# Patient Record
Sex: Male | Born: 1975 | Race: White | Hispanic: No | Marital: Single | Smoking: Never smoker
Health system: Southern US, Community
[De-identification: ages and names within clinical notes are randomized; demographics above are authoritative.]

## PROBLEM LIST (undated history)

## (undated) DIAGNOSIS — A4902 Methicillin resistant Staphylococcus aureus infection, unspecified site: Secondary | ICD-10-CM

## (undated) DIAGNOSIS — Z87442 Personal history of urinary calculi: Secondary | ICD-10-CM

## (undated) DIAGNOSIS — M5126 Other intervertebral disc displacement, lumbar region: Secondary | ICD-10-CM

## (undated) DIAGNOSIS — M51369 Other intervertebral disc degeneration, lumbar region without mention of lumbar back pain or lower extremity pain: Secondary | ICD-10-CM

## (undated) DIAGNOSIS — N200 Calculus of kidney: Secondary | ICD-10-CM

## (undated) DIAGNOSIS — M5136 Other intervertebral disc degeneration, lumbar region: Secondary | ICD-10-CM

## (undated) HISTORY — DX: Methicillin resistant Staphylococcus aureus infection, unspecified site: A49.02

## (undated) HISTORY — DX: Other intervertebral disc degeneration, lumbar region without mention of lumbar back pain or lower extremity pain: M51.369

## (undated) HISTORY — DX: Other intervertebral disc displacement, lumbar region: M51.26

## (undated) HISTORY — DX: Calculus of kidney: N20.0

## (undated) HISTORY — DX: Other intervertebral disc degeneration, lumbar region: M51.36

## (undated) HISTORY — PX: HERNIA REPAIR: SHX51

---

## 2015-11-04 ENCOUNTER — Ambulatory Visit (INDEPENDENT_AMBULATORY_CARE_PROVIDER_SITE_OTHER): Payer: BLUE CROSS/BLUE SHIELD | Admitting: Family Medicine

## 2015-11-04 ENCOUNTER — Encounter: Payer: Self-pay | Admitting: Family Medicine

## 2015-11-04 VITALS — BP 128/82 | HR 78 | Temp 97.5°F | Ht 70.25 in | Wt 174.0 lb

## 2015-11-04 DIAGNOSIS — Z7251 High risk heterosexual behavior: Secondary | ICD-10-CM

## 2015-11-04 DIAGNOSIS — Z0000 Encounter for general adult medical examination without abnormal findings: Secondary | ICD-10-CM | POA: Diagnosis not present

## 2015-11-04 DIAGNOSIS — Z23 Encounter for immunization: Secondary | ICD-10-CM

## 2015-11-04 DIAGNOSIS — M5136 Other intervertebral disc degeneration, lumbar region: Secondary | ICD-10-CM | POA: Insufficient documentation

## 2015-11-04 DIAGNOSIS — M5126 Other intervertebral disc displacement, lumbar region: Secondary | ICD-10-CM | POA: Insufficient documentation

## 2015-11-04 NOTE — Progress Notes (Signed)
Phone: [phone]  Subjective:  Patient presents today to establish care. Last cared for about 10 years ago but has been healthy and no issues- no need to obtain records. Chief complaint-noted.   See problem oriented charting  The following were reviewed and entered/updated in epic: Past Medical History  Diagnosis Date  . L4-L5 disc bulge     Pt states he has rec'd 2 injections 6 months apart, last one was 10/28/15  . MRSA infection     infected hair follice in nose. now cleared   Patient Active Problem List   Diagnosis Date Noted  . L4-L5 disc bulge    Past Surgical History  Procedure Laterality Date  . Hernia repair Bilateral     left 1986, right 2005    Family History  Problem Relation Age of Onset  . Hypertension Father   . Diabetes Father   . Prostate cancer Father     age 34  . Diabetes Maternal Grandmother   . Stroke Maternal Grandfather   . Breast cancer Paternal Grandmother   . Heart disease Paternal Grandfather 59    smoker    Medications- reviewed and updated Current Outpatient Prescriptions  Medication Sig Dispense Refill  . methocarbamol (ROBAXIN) 750 MG tablet Take 1 tablet by mouth 4 (four) times daily as needed.  0   No current facility-administered medications for this visit.    Allergies-reviewed and updated No Known Allergies  Social History   Social History  . Marital Status: Single    Spouse Name: N/A  . Number of Children: N/A  . Years of Education: N/A   Social History Main Topics  . Smoking status: Never Smoker   . Smokeless tobacco: Never Used  . Alcohol Use: 0.0 oz/week    0 Standard drinks or equivalent per week     Comment: 1-2x/year  . Drug Use: No  . Sexual Activity: Yes     Comment: unprotected   Other Topics Concern  . None   Social History Narrative   Single. Have a partner- 9 years. May or may not get married. No kids.    Partner's brother also lives with them.       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    College at [employer]: loves working (states it his hobby) also Financial planner    ROS--Full ROS was completed Review of Systems  Constitutional: Negative for fever and chills.  HENT: Negative for hearing loss and tinnitus.   Eyes: Negative for blurred vision, double vision, pain and discharge.  Respiratory: Negative for cough, hemoptysis, shortness of breath and wheezing.   Cardiovascular: Negative for chest pain, palpitations (rarely after caffeine) and leg swelling.  Gastrointestinal: Negative for heartburn, nausea, vomiting and abdominal pain.  Genitourinary: Negative for dysuria, urgency and frequency.  Musculoskeletal: Positive for back pain. Negative for myalgias and neck pain.  Skin: Negative for itching and rash.  Neurological: Negative for dizziness, tingling and headaches.  Endo/Heme/Allergies: Negative for polydipsia. Does not bruise/bleed easily.  Psychiatric/Behavioral: Negative for depression, suicidal ideas, hallucinations and substance abuse. The patient is not nervous/anxious.    Objective: BP 128/82 mmHg  Pulse 78  Temp(Src) 97.5 F (36.4 C) (Oral)  Ht 5' 10.25" (1.784 m)  Wt 174 lb (78.926 kg)  BMI 24.80 kg/m2  SpO2 98% Gen: NAD, resting comfortably HEENT: Mucous membranes are moist. Oropharynx normal. TM normal. Eyes: sclera and lids normal, PERRLA Neck: no thyromegaly, no  cervical lymphadenopathy CV: RRR no murmurs rubs or gallops Lungs: CTAB no crackles, wheeze, rhonchi Abdomen: soft/nontender/nondistended/normal bowel sounds. No rebound or guarding.  Ext: no edema Skin: warm, dry Neuro: 5/5 strength in upper and lower extremities, normal gait, normal reflexes  Assessment/Plan:  40 y.o. male presenting for annual physical.  Health Maintenance counseling: 1. Anticipatory guidance: Patient counseled regarding regular dental exams, eye exams (lasik surgery), wearing seatbelts.  2. Risk factor  reduction:  Advised patient of need for regular exercise and diet rich and fruits and vegetables to reduce risk of heart attack and stroke. Doing very well here. lost 30 lbs over last year, exercising regulary 3. Immunizations/screenings/ancillary studies- Tdap today  Health Maintenance Due  Topic Date Due  . HIV Screening - with labs 05/22/1993  . TETANUS/TDAP - today 05/22/1997   4. Prostate cancer screening- start at age 31 with dad's history prostate cancer at 57  5. Colon cancer screening - start at age 49 6. STD screening- long term partner but unprotected- wants to update STD testing. Unknown Hep B immunization status.   1-2 year CPE. Return precautions advised.   Orders Placed This Encounter  Procedures  . Tdap vaccine greater than or equal to 7yo IM  . Comprehensive metabolic panel    [place]    Standing Status: Future     Number of Occurrences:      Standing Expiration Date: 11/03/2016  . CBC    Standing Status: Future     Number of Occurrences:      Standing Expiration Date: 11/03/2016  . Lipid panel    Standing Status: Future     Number of Occurrences:      Standing Expiration Date: 11/03/2016  . HIV antibody    [facility]    Standing Status: Future     Number of Occurrences:      Standing Expiration Date: 11/03/2016  . RPR    [facility]    Standing Status: Future     Number of Occurrences:      Standing Expiration Date: 11/03/2016  . Hepatitis B surface antigen    Standing Status: Future     Number of Occurrences:      Standing Expiration Date: 11/03/2016  . Hepatitis B surface antibody    Standing Status: Future     Number of Occurrences:      Standing Expiration Date: 11/03/2016  . Hepatitis B core antibody, total    Standing Status: Future     Number of Occurrences:      Standing Expiration Date: 11/03/2016    Meds ordered this encounter  Medications  . methocarbamol (ROBAXIN) 750 MG tablet    Sig: Take 1 tablet by mouth 4 (four) times daily as needed.    Refill:  0     Garret Reddish, MD

## 2015-11-04 NOTE — Patient Instructions (Addendum)
Tdap today  Schedule a lab visit at the check out desk within 2 weeks. Return for future fasting labs meaning nothing but water after midnight please. Ok to take your medications with water.

## 2015-11-07 ENCOUNTER — Other Ambulatory Visit (HOSPITAL_COMMUNITY)
Admission: RE | Admit: 2015-11-07 | Discharge: 2015-11-07 | Disposition: A | Discharge status: Home / Self Care | Payer: BLUE CROSS/BLUE SHIELD | Source: Ambulatory Visit | Attending: Family Medicine | Admitting: Family Medicine

## 2015-11-07 ENCOUNTER — Other Ambulatory Visit (INDEPENDENT_AMBULATORY_CARE_PROVIDER_SITE_OTHER): Payer: BLUE CROSS/BLUE SHIELD

## 2015-11-07 DIAGNOSIS — Z113 Encounter for screening for infections with a predominantly sexual mode of transmission: Secondary | ICD-10-CM | POA: Diagnosis present

## 2015-11-07 DIAGNOSIS — Z0000 Encounter for general adult medical examination without abnormal findings: Secondary | ICD-10-CM

## 2015-11-07 DIAGNOSIS — Z7251 High risk heterosexual behavior: Secondary | ICD-10-CM

## 2015-11-07 LAB — COMPREHENSIVE METABOLIC PANEL
ALBUMIN: 4.5 g/dL (ref 3.5–5.2)
ALT: 27 U/L (ref 0–53)
AST: 18 U/L (ref 0–37)
Alkaline Phosphatase: 66 U/L (ref 39–117)
BUN: 11 mg/dL (ref 6–23)
CHLORIDE: 101 meq/L (ref 96–112)
CO2: 28 meq/L (ref 19–32)
CREATININE: 0.83 mg/dL (ref 0.40–1.50)
Calcium: 9.2 mg/dL (ref 8.4–10.5)
GFR: 110.53 mL/min (ref >60.00)
Glucose, Bld: 97 mg/dL (ref 70–99)
Potassium: 4 mEq/L (ref 3.5–5.1)
SODIUM: 135 meq/L (ref 135–145)
Total Bilirubin: 0.6 mg/dL (ref 0.2–1.2)
Total Protein: 7.1 g/dL (ref 6.0–8.3)

## 2015-11-07 LAB — CBC
HEMATOCRIT: 46.1 % (ref 39.0–52.0)
Hemoglobin: 15.6 g/dL (ref 13.0–17.0)
MCHC: 34 g/dL (ref 30.0–36.0)
MCV: 87.3 fl (ref 78.0–100.0)
Platelets: 262 10*3/uL (ref 150.0–400.0)
RBC: 5.28 Mil/uL (ref 4.22–5.81)
RDW: 13.7 % (ref 11.5–15.5)
WBC: 6.3 10*3/uL (ref 4.0–10.5)

## 2015-11-07 LAB — LIPID PANEL
CHOLESTEROL: 153 mg/dL (ref 0–200)
HDL: 47.8 mg/dL (ref >39.00)
LDL Cholesterol: 92 mg/dL (ref 0–99)
NonHDL: 105.68
Total CHOL/HDL Ratio: 3
Triglycerides: 70 mg/dL (ref 0.0–149.0)
VLDL: 14 mg/dL (ref 0.0–40.0)

## 2015-11-07 LAB — HEPATITIS B CORE ANTIBODY, TOTAL: HEP B C TOTAL AB: NONREACTIVE

## 2015-11-07 LAB — HIV ANTIBODY (ROUTINE TESTING W REFLEX): HIV: NONREACTIVE

## 2015-11-07 LAB — HEPATITIS B SURFACE ANTIBODY,QUALITATIVE: HEP B S AB: NEGATIVE

## 2015-11-07 LAB — HEPATITIS B SURFACE ANTIGEN: Hepatitis B Surface Ag: NEGATIVE

## 2015-11-08 LAB — RPR

## 2015-11-09 LAB — URINE CYTOLOGY ANCILLARY ONLY
Chlamydia: NEGATIVE
Neisseria Gonorrhea: NEGATIVE
Trichomonas: NEGATIVE

## 2016-04-10 ENCOUNTER — Other Ambulatory Visit (INDEPENDENT_AMBULATORY_CARE_PROVIDER_SITE_OTHER): Payer: BLUE CROSS/BLUE SHIELD

## 2016-04-10 ENCOUNTER — Other Ambulatory Visit (HOSPITAL_COMMUNITY)
Admission: RE | Admit: 2016-04-10 | Discharge: 2016-04-10 | Disposition: A | Discharge status: Home / Self Care | Payer: BLUE CROSS/BLUE SHIELD | Source: Ambulatory Visit | Attending: Family Medicine | Admitting: Family Medicine

## 2016-04-10 ENCOUNTER — Telehealth: Payer: Self-pay

## 2016-04-10 DIAGNOSIS — Z7251 High risk heterosexual behavior: Secondary | ICD-10-CM

## 2016-04-10 DIAGNOSIS — Z113 Encounter for screening for infections with a predominantly sexual mode of transmission: Secondary | ICD-10-CM | POA: Diagnosis not present

## 2016-04-10 NOTE — Telephone Encounter (Signed)
Patient aware.

## 2016-04-10 NOTE — Telephone Encounter (Signed)
Panel ordered. No urinating an hour before urine test. Dirty urine- pee straight into cup small amount without cleaning tip of penis.   This does not test for herpes or genital warts- those are based on visual inspection and testing usually

## 2016-04-10 NOTE — Telephone Encounter (Signed)
Patient requesting a full STD panel to be done. Would like to come by today at lunch to have labs drawn.

## 2016-04-11 LAB — HIV ANTIBODY (ROUTINE TESTING W REFLEX): HIV: NONREACTIVE

## 2016-04-11 LAB — RPR

## 2016-04-11 LAB — URINE CYTOLOGY ANCILLARY ONLY
CHLAMYDIA, DNA PROBE: NEGATIVE
NEISSERIA GONORRHEA: NEGATIVE
Trichomonas: NEGATIVE

## 2016-04-23 ENCOUNTER — Other Ambulatory Visit: Payer: Self-pay

## 2016-04-23 ENCOUNTER — Telehealth: Payer: Self-pay

## 2016-04-23 MED ORDER — PREDNISONE 20 MG PO TABS: 20.0000 mg | ORAL_TABLET | Freq: Every day | ORAL | 0 refills | Status: DC

## 2016-04-23 NOTE — Telephone Encounter (Signed)
Patient called and is sneezing and blowing out green mucous. Was on a cruise last week and got sick starting Tuesday.   States drainage was clear Tuesday & Wed. Thursday it turned yellow. Friday it was a dark yellow. Saturday it turned green and is still green.  He wanted to know if he can have something called in to [facility] (corner of [facility])?  He can't come in today as he is short staffed.

## 2016-04-23 NOTE — Telephone Encounter (Signed)
Day 7 of illness. Could still be viral sinusitis. If he would like to trial something we could do 5 day prednisone burst of 40mg  for 3 days then 20mg  for 2 days using 20mg  tablet. If not improving by Wednesday- we could call in antibiotic- would be day 9 but would be before holiday weekend so would be willing to call in. Would also advise mucinex OTC

## 2016-04-23 NOTE — Telephone Encounter (Signed)
Spoke with patient who verbalized understanding. Prescription for Prednisone sent to pharmacy

## 2016-11-16 ENCOUNTER — Encounter: Payer: Self-pay | Admitting: Family Medicine

## 2016-11-16 ENCOUNTER — Ambulatory Visit (INDEPENDENT_AMBULATORY_CARE_PROVIDER_SITE_OTHER): Payer: 59 | Admitting: Family Medicine

## 2016-11-16 VITALS — BP 120/82 | HR 88 | Temp 98.2°F | Ht 71.75 in | Wt 187.8 lb

## 2016-11-16 DIAGNOSIS — Z13220 Encounter for screening for lipoid disorders: Secondary | ICD-10-CM

## 2016-11-16 DIAGNOSIS — Z0000 Encounter for general adult medical examination without abnormal findings: Secondary | ICD-10-CM

## 2016-11-16 DIAGNOSIS — Z79899 Other long term (current) drug therapy: Secondary | ICD-10-CM | POA: Diagnosis not present

## 2016-11-16 DIAGNOSIS — Z23 Encounter for immunization: Secondary | ICD-10-CM

## 2016-11-16 NOTE — Patient Instructions (Addendum)
Twinrix today (Hepatitis A and B combo vaccine). jamie can help you schedule 1 month repeat and 6 month repeat injections.   Please stop by lab before you go  I like the idea of you getting back below 180.

## 2016-11-16 NOTE — Progress Notes (Signed)
Phone: [phone]  Subjective:  Patient presents today for their annual physical. Chief complaint-noted.   See problem oriented charting- ROS- full  review of systems was completed and negative except for low back pain usually improves with injections. No chest pain or shortness of breath. No headache or blurry vision.   The following were reviewed and entered/updated in epic: Past Medical History:  Diagnosis Date  . L4-L5 disc bulge    Pt states he has rec'd 2 injections 6 months apart, last one was 10/28/15  . MRSA infection    infected hair follice in nose. now cleared   Patient Active Problem List   Diagnosis Date Noted  . L4-L5 disc bulge    Past Surgical History:  Procedure Laterality Date  . HERNIA REPAIR Bilateral    left 1986, right 2005   Family History  Problem Relation Age of Onset  . Hypertension Father   . Diabetes Father   . Prostate cancer Father        age 63  . Diabetes Maternal Grandmother   . Stroke Maternal Grandfather   . Breast cancer Paternal Grandmother   . Heart disease Paternal Grandfather 40       smoker    Medications- reviewed and updated Current Outpatient Prescriptions  Medication Sig Dispense Refill  . methocarbamol (ROBAXIN) 750 MG tablet Take 1 tablet by mouth 4 (four) times daily as needed.  0   No current facility-administered medications for this visit.     Allergies-reviewed and updated No Known Allergies  Social History   Social History  . Marital status: Single    Spouse name: N/A  . Number of children: N/A  . Years of education: N/A   Social History Main Topics  . Smoking status: Never Smoker  . Smokeless tobacco: Never Used  . Alcohol use 0.0 oz/week     Comment: 1-2x/year  . Drug use: No  . Sexual activity: Yes     Comment: unprotected   Other Topics Concern  . None   Social History Narrative   Single. Have a partner- 9 years. May or may not get married. No kids.    Partner's brother also lives with  them.       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 Oropharynx normal Neck: no thyromegaly CV: RRR no murmurs rubs or gallops Lungs: CTAB no crackles, wheeze, rhonchi Abdomen: soft/nontender/nondistended/normal bowel sounds. No rebound or guarding.  Ext: no edema Skin: warm, dry, scaly red lesion on right forehead (to see dermatology) Neuro: grossly normal, moves all extremities, PERRLA  Assessment/Plan:  41 y.o. male presenting for annual physical.  Health Maintenance counseling: 1. Anticipatory guidance: Patient counseled regarding regular dental exams q6 months, eye exams -yearly due to lasik , wearing seatbelts.  2. Risk factor reduction:  Advised patient of need for regular exercise and diet rich and fruits and vegetables to reduce risk of heart attack and stroke. Exercise- 3 days a week at least. Diet-sleep has been down  and not eating quite as well. Prior had lost 30 pounds but now gained 13 lbs back. 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 Immunizations/screenings/ancillary studies-  Up to date other than Hep B and Hep A - opts in for these Immunization History  Administered Date(s) Administered  . Influenza-Unspecified 03/28/2016  . Tdap 11/04/2015  4. Prostate cancer screening- family history in father, he requests to start at age 66  5. Colon cancer screening - no family  history, start at age 51-50 6. Skin cancer screening/prevention- advised regular sunscreen use. Will see [facility] dermatology- possible [place] on scalp- prefer derm for cosmetic reasons 7. Testicular cancer screening- advised monthly self exams  8. STD screening- patient opts out- screened in November and monogamous since that time  Status of chronic or acute concerns   Injections through Dr. Nelva Bush for bulging disc recently about every 8 months or so  Screen lipids  Update cbc, cmp given intermittent robaxin and prednisone.   Return in about 1 year (around 11/16/2017) for physical.  Orders Placed This Encounter  Procedures  . CBC with Differential/Platelet  . Comprehensive metabolic panel    [place]    Order Specific Question:   Has the patient fasted?    Answer:   No  . Lipid panel    [place]    Order Specific Question:   Has the patient fasted?    Answer:   No   Return precautions advised.  Garret Reddish, MD

## 2016-11-16 NOTE — Addendum Note (Signed)
Addended by: Lucianne Lei M on: 11/16/2016 12:17 PM   Modules accepted: Orders

## 2016-11-19 ENCOUNTER — Other Ambulatory Visit (INDEPENDENT_AMBULATORY_CARE_PROVIDER_SITE_OTHER): Payer: 59

## 2016-11-19 ENCOUNTER — Other Ambulatory Visit: Payer: Self-pay

## 2016-11-19 ENCOUNTER — Encounter: Payer: Self-pay | Admitting: Family Medicine

## 2016-11-19 DIAGNOSIS — E785 Hyperlipidemia, unspecified: Secondary | ICD-10-CM | POA: Insufficient documentation

## 2016-11-19 DIAGNOSIS — Z13220 Encounter for screening for lipoid disorders: Secondary | ICD-10-CM | POA: Diagnosis not present

## 2016-11-19 DIAGNOSIS — Z79899 Other long term (current) drug therapy: Secondary | ICD-10-CM | POA: Diagnosis not present

## 2016-11-19 DIAGNOSIS — Z0000 Encounter for general adult medical examination without abnormal findings: Secondary | ICD-10-CM | POA: Diagnosis not present

## 2016-11-19 LAB — CBC WITH DIFFERENTIAL/PLATELET
Basophils Absolute: 0 10*3/uL (ref 0.0–0.1)
Basophils Relative: 0.6 % (ref 0.0–3.0)
Eosinophils Absolute: 0.1 10*3/uL (ref 0.0–0.7)
Eosinophils Relative: 1.5 % (ref 0.0–5.0)
HCT: 47 % (ref 39.0–52.0)
Hemoglobin: 16.2 g/dL (ref 13.0–17.0)
LYMPHS ABS: 2 10*3/uL (ref 0.7–4.0)
Lymphocytes Relative: 34.4 % (ref 12.0–46.0)
MCHC: 34.4 g/dL (ref 30.0–36.0)
MCV: 86.9 fl (ref 78.0–100.0)
MONOS PCT: 6.9 % (ref 3.0–12.0)
Monocytes Absolute: 0.4 10*3/uL (ref 0.1–1.0)
NEUTROS ABS: 3.3 10*3/uL (ref 1.4–7.7)
NEUTROS PCT: 56.6 % (ref 43.0–77.0)
PLATELETS: 247 10*3/uL (ref 150.0–400.0)
RBC: 5.42 Mil/uL (ref 4.22–5.81)
RDW: 13.2 % (ref 11.5–15.5)
WBC: 5.8 10*3/uL (ref 4.0–10.5)

## 2016-11-19 LAB — LIPID PANEL
Cholesterol: 203 mg/dL — ABNORMAL HIGH (ref 0–200)
HDL: 44.7 mg/dL (ref >39.00)
LDL Cholesterol: 125 mg/dL — ABNORMAL HIGH (ref 0–99)
NONHDL: 158.59
TRIGLYCERIDES: 167 mg/dL — AB (ref 0.0–149.0)
Total CHOL/HDL Ratio: 5
VLDL: 33.4 mg/dL (ref 0.0–40.0)

## 2016-11-19 LAB — COMPREHENSIVE METABOLIC PANEL
ALT: 26 U/L (ref 0–53)
AST: 16 U/L (ref 0–37)
Albumin: 4.5 g/dL (ref 3.5–5.2)
Alkaline Phosphatase: 55 U/L (ref 39–117)
BILIRUBIN TOTAL: 0.6 mg/dL (ref 0.2–1.2)
BUN: 14 mg/dL (ref 6–23)
CO2: 28 mEq/L (ref 19–32)
CREATININE: 0.92 mg/dL (ref 0.40–1.50)
Calcium: 9.5 mg/dL (ref 8.4–10.5)
Chloride: 101 mEq/L (ref 96–112)
GFR: 97.6 mL/min (ref >60.00)
GLUCOSE: 94 mg/dL (ref 70–99)
Potassium: 4.6 mEq/L (ref 3.5–5.1)
Sodium: 137 mEq/L (ref 135–145)
Total Protein: 6.7 g/dL (ref 6.0–8.3)

## 2016-12-20 ENCOUNTER — Ambulatory Visit (INDEPENDENT_AMBULATORY_CARE_PROVIDER_SITE_OTHER): Payer: 59

## 2016-12-20 DIAGNOSIS — Z23 Encounter for immunization: Secondary | ICD-10-CM | POA: Diagnosis not present

## 2017-05-22 ENCOUNTER — Ambulatory Visit (INDEPENDENT_AMBULATORY_CARE_PROVIDER_SITE_OTHER): Payer: 59

## 2017-05-22 DIAGNOSIS — Z23 Encounter for immunization: Secondary | ICD-10-CM | POA: Diagnosis not present

## 2017-05-22 NOTE — Progress Notes (Signed)
Patient in office today to receive Flu Vaccine and last of Twinrix Injection. Patient tolerated well. VIS sheets given

## 2017-06-03 ENCOUNTER — Encounter: Payer: Self-pay | Admitting: Family Medicine

## 2017-06-03 ENCOUNTER — Ambulatory Visit: Payer: 59 | Admitting: Family Medicine

## 2017-06-03 VITALS — BP 118/88 | HR 85 | Temp 97.7°F | Ht 71.75 in | Wt 196.4 lb

## 2017-06-03 DIAGNOSIS — K625 Hemorrhage of anus and rectum: Secondary | ICD-10-CM | POA: Diagnosis not present

## 2017-06-03 NOTE — Progress Notes (Signed)
Subjective:  Ronald Tyler is a 41 y.o. year old very pleasant male patient who presents for/with See problem oriented charting ROS- No chest pain or shortness of breath. No headache or blurry vision. No melena. Denies receptive anal intercourse   Past Medical History-  Patient Active Problem List   Diagnosis Date Noted  . Hyperlipidemia 11/19/2016  . L4-L5 disc bulge     Medications- reviewed and updated Current Outpatient Medications  Medication Sig Dispense Refill  . methocarbamol (ROBAXIN) 750 MG tablet Take 1 tablet by mouth 4 (four) times daily as needed.  0   No current facility-administered medications for this visit.     Objective: BP 118/88 (BP Location: Left Arm, Patient Position: Sitting, Cuff Size: Large)   Pulse 85   Temp 97.7 F (36.5 C) (Oral)   Ht 5' 11.75" (1.822 m)   Wt 196 lb 6.4 oz (89.1 kg)   SpO2 96%   BMI 26.82 kg/m  Gen: NAD, resting comfortably CV: RRR no murmurs rubs or gallops Lungs: CTAB no crackles, wheeze, rhonchi Ext: no edema Skin: warm, dry Rectal: no external hemorrhoids seen. Internal exam done without pain and without obvious internal hemorrhoids. Discussed anoscopy and jointly agreed against.   Assessment/Plan:  Rectal bleeding - Plan: Hemoccult Cards (X3 cards) S: noted some rectal bleeding about 3 weeks ago. Feels like hard stool related. A day or two later had light amount. Then a few weeks later had 4 hard stools and had some bleeding again. Stool softeners were taken and stools softened and bleeding stopped. Generic colace. Had spots like this once or twice over last 10 years usually after firm stool only. Never has issues with intercourse- denies recent receptive intercourse anyway (homosexual male). No family history of colon cancer- Father has had polyps but were benign and on 10 year schedule.   Mainly on toilet paper and there may be a drop or two in the toilet.  A/P: no family history of colon cancer or adenomatous  polyps. Doubt colon cancer or polyps- sounds like always related to firm stool and likelihood high this is hemorrhoidal in nature.  Patient Instructions  I agree with you- strongly suspect hemorrhoids as cause of blood in the stool though I do not see or feel the obvious culprit on exam.   Would make sure to drink at least 60 oz of water a day and 3-5 servings of fruits/veggies to keep stool softer- generic colace is also a fine choice as you have been using.   About a month from now lets get you to do these stool cards (as long as no recent bleeding) and if those are negative then it makes [place] less concerned about a higher source of bleeding. Pick these cards up from the lab  Orders Placed This Encounter  Procedures  . Hemoccult Cards (X3 cards)    Standing Status:   Future    Standing Expiration Date:   06/03/2018   Return precautions advised.  Garret Reddish, MD

## 2017-06-03 NOTE — Patient Instructions (Signed)
I agree with you- strongly suspect hemorrhoids as cause of blood in the stool though I do not see or feel the obvious culprit on exam.   Would make sure to drink at least 60 oz of water a day and 3-5 servings of fruits/veggies to keep stool softer- generic colace is also a fine choice as you have been using.   About a month from now lets get you to do these stool cards (as long as no recent bleeding) and if those are negative then it makes [place] less concerned about a higher source of bleeding. Pick these cards up from the lab

## 2017-09-19 DIAGNOSIS — L570 Actinic keratosis: Secondary | ICD-10-CM | POA: Diagnosis not present

## 2017-09-19 DIAGNOSIS — D2271 Melanocytic nevi of right lower limb, including hip: Secondary | ICD-10-CM | POA: Diagnosis not present

## 2017-09-19 DIAGNOSIS — D2262 Melanocytic nevi of left upper limb, including shoulder: Secondary | ICD-10-CM | POA: Diagnosis not present

## 2017-11-13 ENCOUNTER — Ambulatory Visit (INDEPENDENT_AMBULATORY_CARE_PROVIDER_SITE_OTHER): Payer: 59

## 2017-11-13 ENCOUNTER — Ambulatory Visit: Payer: 59 | Admitting: Family Medicine

## 2017-11-13 ENCOUNTER — Encounter: Payer: Self-pay | Admitting: Family Medicine

## 2017-11-13 VITALS — BP 100/78 | HR 98 | Temp 97.8°F | Ht 71.75 in | Wt 192.0 lb

## 2017-11-13 DIAGNOSIS — M25571 Pain in right ankle and joints of right foot: Secondary | ICD-10-CM | POA: Diagnosis not present

## 2017-11-13 DIAGNOSIS — S99911A Unspecified injury of right ankle, initial encounter: Secondary | ICD-10-CM | POA: Diagnosis not present

## 2017-11-13 DIAGNOSIS — M7989 Other specified soft tissue disorders: Secondary | ICD-10-CM | POA: Diagnosis not present

## 2017-11-13 NOTE — Progress Notes (Addendum)
Subjective:  Ronald Tyler is a 42 y.o. year old very pleasant male patient who presents for/with See problem oriented charting ROS-  able to walk 4 steps after incident, some edema/swelling near area of pain, no fever, chills, expanding redness- pain and swelling actually improving some today  Past Medical History-  Patient Active Problem List   Diagnosis Date Noted  . Hyperlipidemia 11/19/2016  . L4-L5 disc bulge     Medications- reviewed and updated Current Outpatient Medications  Medication Sig Dispense Refill  . methocarbamol (ROBAXIN) 750 MG tablet Take 1 tablet by mouth 4 (four) times daily as needed.  0   No current facility-administered medications for this visit.     Objective: BP 100/78 (BP Location: Left Arm, Patient Position: Sitting, Cuff Size: Large)   Pulse 98   Temp 97.8 F (36.6 C) (Oral)   Ht 5' 11.75" (1.822 m)   Wt 192 lb (87.1 kg)   SpO2 97%   BMI 26.22 kg/m  Gen: NAD, resting comfortably CV: RRR  Lungs: non labored, normal respiratory rate Abdomen: soft/nontender/nondistended Ext: no edema Skin: warm, dry Neuro: grossly normal, moves all extremities  Right Ankle: Some swelling but no erythema.  Range of motion is full in all directions. Strength is 5/5 in all directions.  squeeze test unremarkable;  No pain at base of 5th MT  Mild to moderate tenderness on posterior aspects of lateral and medial malleolus Able to walk 4 steps.  Right ankle x-ray- no obvious fracture on my review of film.  We will await radiology overread  Assessment/Plan:  Right ankle pain S: Right ankle pain after missing a step and hitting the floor hard/turning the ankle. He did not fall down but quickly caught himself on the wall. Heard a cracking knuckles type sound Noted immediate pain- swelling worsened over the day. Rates pain as 2/10, yesterday 3-4. Pain and swelling improved some this AM. He reports he tried rest, ice, elevation.  A/P: From AVS:  " This  looks like a right ankle sprain- we will await radiology over read on your x-ray to confirm this  Continue relative rest for the ankle-avoid anything that worsens pain such as sharp turns  Try ice the ankle 20 minutes 3-4 times a day for the first 3 days  Elevate as much as possible  Ronald Tyler could show you how to do an Ace wrap if you would like for compression.  Do the exercises 3 times a week for a month-stop anything that causes more than 1 or 2 out of 10 pain.  After that month can do exercises once a week for another month or 2 then stop  If pain worsens at any time happy to see you back or we could get you with [name] our sports medicine specialist "  Lab/Order associations: Acute right ankle pain - Plan: DG Ankle Complete Right  Return precautions advised.  Ronald Reddish, MD

## 2017-11-13 NOTE — Patient Instructions (Signed)
This looks like a right ankle sprain- we will await radiology over read on your x-ray to confirm this  Continue relative rest for the ankle-avoid anything that worsens pain such as sharp turns  Try ice the ankle 20 minutes 3-4 times a day for the first 3 days  Elevate as much as possible  [name] could show you how to do an Ace wrap if you would like for compression.  Do the exercises 3 times a week for a month-stop anything that causes more than 1 or 2 out of 10 pain.  After that month can do exercises once a week for another month or 2 then stop  If pain worsens at any time happy to see you back or we could get you with [name] our sports medicine specialist

## 2017-12-24 ENCOUNTER — Other Ambulatory Visit: Payer: Self-pay

## 2017-12-24 ENCOUNTER — Telehealth: Payer: Self-pay

## 2017-12-24 DIAGNOSIS — M542 Cervicalgia: Secondary | ICD-10-CM

## 2017-12-24 DIAGNOSIS — M25512 Pain in left shoulder: Secondary | ICD-10-CM

## 2017-12-24 NOTE — Telephone Encounter (Signed)
Yes thanks, may refer 

## 2017-12-24 NOTE — Telephone Encounter (Signed)
Patient called and is requesting dry needle therapy/PT for left shoulder/neck pain. 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 Massage therapy 1-2 days a week. Pain in shoulder/neck when turns head to right side. Pain is relieved for about 24 hours after massage therapy. Describes shoulder as feeling tight. OK to refer?

## 2017-12-24 NOTE — Telephone Encounter (Signed)
Referral placed.

## 2017-12-25 ENCOUNTER — Encounter: Payer: Self-pay | Admitting: Physical Therapy

## 2017-12-25 ENCOUNTER — Ambulatory Visit: Payer: 59 | Admitting: Physical Therapy

## 2017-12-25 DIAGNOSIS — M542 Cervicalgia: Secondary | ICD-10-CM | POA: Diagnosis not present

## 2017-12-25 NOTE — Patient Instructions (Signed)
Access Code: [email or web address]  URL: [email or web address]  Date: 12/25/2017  Prepared by: Lyndee Hensen   Exercises  Seated Cervical Sidebending Stretch - 3 reps - 30 hold - 3x daily  Seated Levator Scapulae Stretch - 3 reps - 30 hold - 3x daily

## 2017-12-29 ENCOUNTER — Encounter: Payer: Self-pay | Admitting: Physical Therapy

## 2017-12-29 NOTE — Therapy (Signed)
[facility] [address] [place], [place], [address] Phone: [phone]   Fax:  [phone]  Physical Therapy Evaluation  Patient Details  Name: Ronald Tyler MRN: [id number] Date of Birth: 15-May-1976 Referring Provider: Garret Reddish   Encounter Date: 12/25/2017  PT End of Session - 12/29/17 2140    Visit Number  1    Number of Visits  12    Date for PT Re-Evaluation  02/05/18    PT Start Time  6967    PT Stop Time  1644    PT Time Calculation (min)  46 min    Activity Tolerance  Patient tolerated treatment well    Behavior During Therapy  [facility] for tasks assessed/performed       Past Medical History:  Diagnosis Date  . L4-L5 disc bulge    Pt states he has rec'd 2 injections 6 months apart, last one was 10/28/15  . MRSA infection    infected hair follice in nose. now cleared    Past Surgical History:  Procedure Laterality Date  . HERNIA REPAIR Bilateral    left 1986, right 2005    There were no vitals filed for this visit.   Subjective Assessment - 12/29/17 2137    Subjective  Pt states onset of significant pain in L Upper trap, neck region. He states that he has had tightness in neck before that has subsided with massage,but this has not. He works out several times per week, and gets massage 1x/wk, but continues to have pain.  Pt does not recall incident to start pain. He works in [employer], [place], walking. L handed.     Limitations  House hold activities;Lifting;Reading    Patient Stated Goals  Decreased pain, improved neck movement.     Currently in Pain?  Yes    Pain Score  4     Pain Location  Neck    Pain Orientation  Left    Pain Descriptors / Indicators  Aching;Tightness    Pain Type  Acute pain    Pain Onset  More than a month ago    Pain Frequency  Intermittent    Aggravating Factors   neck movement, driving, sitting,     Pain Relieving Factors  none         [facility] PT Assessment -  12/29/17 0001      Assessment   Medical Diagnosis  Neck pain, Pain in L shoulder    Referring Provider  Garret Reddish    Onset Date/Surgical Date  11/26/17    Hand Dominance  Left    Prior Therapy  no      Precautions   Precautions  None      Balance Screen   Has the patient fallen in the past 6 months  No      Prior Function   Level of Independence  Independent      Cognition   Overall Cognitive Status  Within Functional Limits for tasks assessed      ROM / Strength   AROM / PROM / Strength  AROM;Strength      AROM   Overall AROM Comments  Cervical- Flexion: WFL, Extension: moderate deficit and increased pain;  L rotation: moderate deficit and increased pain;  R rotation: mild deficit, mild pain on L;  L SB: Pain, R SB: Pain;   Shoulder ROM: WFL;       Strength   Overall Strength Comments  Shoulder strength: 4+/5 to 5/5  gross Bil;       Palpation   Palpation comment  Significant tightness and tenderness at L UT, levator, rhomboid.  Minimal pain into sub occipital region;       Special Tests   Other special tests  Denies dizziness, headache, UE numbness/tingling;  + painful triggerpoints in Upper trap, levator, rhomboid region;                 Objective measurements completed on examination: See above findings.      [facility] Adult PT Treatment/Exercise - 12/29/17 0001      Exercises   Exercises  Neck      Neck Exercises: Seated   Other Seated Exercise  Scap retraction x20;       Manual Therapy   Manual therapy comments  Skilled palpation of trigger points during dry needling;       Neck Exercises: Stretches   Upper Trapezius Stretch  30 seconds;2 reps    Levator Stretch  30 seconds;2 reps       Trigger Point Dry Needling - 12/29/17 2136    Consent Given?  Yes    Education Handout Provided  Yes    Muscles Treated Upper Body  Upper trapezius;Levator scapulae    Upper Trapezius Response  Twitch reponse elicited;Palpable increased muscle length     Levator Scapulae Response  Twitch response elicited;Palpable increased muscle length           PT Education - 12/29/17 2140    Education Details  PT POC, Dx, HEP Dry needling, informed consent obtained.     Person(s) Educated  Patient    Methods  Handout;Explanation    Comprehension  Verbalized understanding       PT Short Term Goals - 12/29/17 2144      PT SHORT TERM GOAL #1   Title  Pt to report decreased pain in L cervical region to 2/10     Time  2    Period  Weeks    Status  New    Target Date  01/08/18      PT SHORT TERM GOAL #2   Title  Pt to be independent with initial HEP    Time  2    Period  Weeks    Status  New    Target Date  01/08/18        PT Long Term Goals - 12/29/17 2145      PT LONG TERM GOAL #1   Title  Pt to demo improved cervical ROM, to be WNL , to improve ability for driving and work duties.     Time  6    Period  Weeks    Status  New    Target Date  02/05/18      PT LONG TERM GOAL #2   Title  Pt to report decreased pain in cervical region to 0-1/10 with palpation, and with AROM, to improve ability for IADLs.     Time  6    Period  Weeks    Status  New    Target Date  02/05/18      PT LONG TERM GOAL #3   Title  Pt to be independent with final HEP for ROM, strength, and posture.     Time  6    Period  Weeks    Status  New    Target Date  02/05/18             Plan - 12/29/17 2141  Clinical Impression Statement  Pt presents wtih primary complaint of increased pain in L sided neck region. He has painful trigger points in L UT, levator, and rhomboid region. Muscle tightness is causing significant lack of cervical ROM, and increased pain with ROM. Pt with decreased ability for full functional activities, and work duties, due to pain. Pt to benefit from skilled PT to improve deficits and return to PLOF without pain.     Clinical Presentation  Stable    Clinical Decision Making  Low    Rehab Potential  Good    PT Frequency  2x /  week    PT Duration  6 weeks    PT Treatment/Interventions  ADLs/Self Care Home Management;Cryotherapy;Electrical Stimulation;Iontophoresis 4mg /ml Dexamethasone;Moist Heat;Therapeutic activities;Functional mobility training;Ultrasound;Therapeutic exercise;Traction;Neuromuscular re-education;Patient/family education;Dry needling;Passive range of motion;Manual techniques;Taping    Consulted and Agree with Plan of Care  Patient       Patient will benefit from skilled therapeutic intervention in order to improve the following deficits and impairments:  Hypomobility, Decreased activity tolerance, Pain, Increased muscle spasms, Decreased mobility, Decreased range of motion, Improper body mechanics  Visit Diagnosis: Neck pain     Problem List Patient Active Problem List   Diagnosis Date Noted  . Hyperlipidemia 11/19/2016  . L4-L5 disc bulge     Lyndee Hensen, PT, DPT 9:50 PM  12/29/17    [facility] [place], [place], [address] Phone: [phone]   Fax:  [phone]  Name: Cornie Herrington MRN: [id number] Date of Birth: [birth year]

## 2017-12-30 ENCOUNTER — Encounter: Payer: Self-pay | Admitting: Physical Therapy

## 2017-12-30 ENCOUNTER — Ambulatory Visit: Payer: 59 | Admitting: Physical Therapy

## 2017-12-30 DIAGNOSIS — M542 Cervicalgia: Secondary | ICD-10-CM | POA: Diagnosis not present

## 2017-12-30 NOTE — Therapy (Signed)
[facility] [address] [place], [place], [address] Phone: [phone]   Fax:  [phone]  Physical Therapy Treatment  Patient Details  Name: Ronald Tyler MRN: [id number] Date of Birth: Dec 16, 1975 Referring Provider: Garret Reddish   Encounter Date: 12/30/2017  PT End of Session - 12/30/17 1603    Visit Number  2    Number of Visits  12    Date for PT Re-Evaluation  02/05/18    PT Start Time  4431    PT Stop Time  1608    PT Time Calculation (min)  52 min    Activity Tolerance  Patient tolerated treatment well    Behavior During Therapy  [facility] for tasks assessed/performed       Past Medical History:  Diagnosis Date  . L4-L5 disc bulge    Pt states he has rec'd 2 injections 6 months apart, last one was 10/28/15  . MRSA infection    infected hair follice in nose. now cleared    Past Surgical History:  Procedure Laterality Date  . HERNIA REPAIR Bilateral    left 1986, right 2005    There were no vitals filed for this visit.  Subjective Assessment - 12/30/17 1602    Subjective  Pt states very good response after last session, was mostly pain free, with improved ROM, but then had increased pain after working out on Saturday and sunday. He notes some improvement today.     Currently in Pain?  Yes    Pain Score  3     Pain Location  Neck    Pain Orientation  Left    Pain Descriptors / Indicators  Aching;Tightness    Pain Type  Acute pain    Pain Onset  More than a month ago    Pain Frequency  Intermittent                       [facility] Adult PT Treatment/Exercise - 12/30/17 1518      Exercises   Exercises  Neck      Neck Exercises: Standing   Other Standing Exercises  Rows BlTB x30;  Bil ER GTB x25;       Neck Exercises: Seated   Other Seated Exercise  --      Neck Exercises: Supine   Neck Retraction  20 reps      Modalities   Modalities  Moist Heat      Moist Heat Therapy   Number  Minutes Moist Heat  10 Minutes    Moist Heat Location  Cervical      Manual Therapy   Manual Therapy  Soft tissue mobilization    Manual therapy comments  Skilled palpation of trigger points during dry needling;     Soft tissue mobilization  Manual distraction 10 sec x8;  STM/ IASTM to L upper trap, levator, rhomboid region;       Neck Exercises: Stretches   Upper Trapezius Stretch  30 seconds;2 reps    Levator Stretch  30 seconds;2 reps       Trigger Point Dry Needling - 12/30/17 1604    Consent Given?  Yes    Muscles Treated Upper Body  Upper trapezius    Upper Trapezius Response  Twitch reponse elicited;Palpable increased muscle length           PT Education - 12/29/17 2140    Education Details  PT POC, Dx, HEP Dry needling, informed consent obtained.  Person(s) Educated  Patient    Methods  Handout;Explanation    Comprehension  Verbalized understanding       PT Short Term Goals - 12/29/17 2144      PT SHORT TERM GOAL #1   Title  Pt to report decreased pain in L cervical region to 2/10     Time  2    Period  Weeks    Status  New    Target Date  01/08/18      PT SHORT TERM GOAL #2   Title  Pt to be independent with initial HEP    Time  2    Period  Weeks    Status  New    Target Date  01/08/18        PT Long Term Goals - 12/29/17 2145      PT LONG TERM GOAL #1   Title  Pt to demo improved cervical ROM, to be WNL , to improve ability for driving and work duties.     Time  6    Period  Weeks    Status  New    Target Date  02/05/18      PT LONG TERM GOAL #2   Title  Pt to report decreased pain in cervical region to 0-1/10 with palpation, and with AROM, to improve ability for IADLs.     Time  6    Period  Weeks    Status  New    Target Date  02/05/18      PT LONG TERM GOAL #3   Title  Pt to be independent with final HEP for ROM, strength, and posture.     Time  6    Period  Weeks    Status  New    Target Date  02/05/18            Plan  - 12/30/17 1652    Clinical Impression Statement  Pt with good response to dry needling today, with improved tension and pain in L UT. He has improved muscle length and improved ROM for cervical rotation. Discussed possibly altering/modifying workout if it is increasing pain and tension in neck.     Rehab Potential  Good    PT Frequency  2x / week    PT Duration  6 weeks    PT Treatment/Interventions  ADLs/Self Care Home Management;Cryotherapy;Electrical Stimulation;Iontophoresis 4mg /ml Dexamethasone;Moist Heat;Therapeutic activities;Functional mobility training;Ultrasound;Therapeutic exercise;Traction;Neuromuscular re-education;Patient/family education;Dry needling;Passive range of motion;Manual techniques;Taping    Consulted and Agree with Plan of Care  Patient       Patient will benefit from skilled therapeutic intervention in order to improve the following deficits and impairments:  Hypomobility, Decreased activity tolerance, Pain, Increased muscle spasms, Decreased mobility, Decreased range of motion, Improper body mechanics  Visit Diagnosis: Neck pain     Problem List Patient Active Problem List   Diagnosis Date Noted  . Hyperlipidemia 11/19/2016  . L4-L5 disc bulge    Lyndee Hensen, PT, DPT 4:54 PM  12/30/17    [facility] [place], [place], [address] Phone: [phone]   Fax:  [phone]  Name: Ronald Tyler MRN: [id number] Date of Birth: 12-04-1975

## 2018-01-02 ENCOUNTER — Encounter: Payer: Self-pay | Admitting: Physical Therapy

## 2018-01-02 ENCOUNTER — Ambulatory Visit: Payer: 59 | Admitting: Physical Therapy

## 2018-01-02 DIAGNOSIS — M542 Cervicalgia: Secondary | ICD-10-CM

## 2018-01-02 NOTE — Therapy (Signed)
[facility] [address] [place], [place], [address] Phone: [phone]   Fax:  [phone]  Physical Therapy Treatment  Patient Details  Name: Ronald Tyler MRN: [id number] Date of Birth: 1975-07-20 Referring Provider: Garret Reddish   Encounter Date: 01/02/2018  PT End of Session - 01/02/18 2158    Visit Number  3    Number of Visits  12    Date for PT Re-Evaluation  02/05/18    PT Start Time  1601    PT Stop Time  1641    PT Time Calculation (min)  40 min    Activity Tolerance  Patient tolerated treatment well    Behavior During Therapy  [facility] for tasks assessed/performed       Past Medical History:  Diagnosis Date  . L4-L5 disc bulge    Pt states he has rec'd 2 injections 6 months apart, last one was 10/28/15  . MRSA infection    infected hair follice in nose. now cleared    Past Surgical History:  Procedure Laterality Date  . HERNIA REPAIR Bilateral    left 1986, right 2005    There were no vitals filed for this visit.  Subjective Assessment - 01/02/18 2157    Subjective  Pt states significant improvement of pain, and improved cervical ROM. He was able to work out yesterday ( did modify) with no increaesd pain.     Currently in Pain?  Yes    Pain Score  2     Pain Location  Neck    Pain Orientation  Left    Pain Descriptors / Indicators  Aching;Tightness    Pain Type  Acute pain    Pain Onset  More than a month ago    Pain Frequency  Intermittent                       [facility] Adult PT Treatment/Exercise - 01/02/18 1559      Exercises   Exercises  Neck      Neck Exercises: Standing   Other Standing Exercises  Rows BlTB x30; Low Row GTB x20;  Bil ER GTB x25;     Other Standing Exercises  Scap pull outs GTB x20;       Neck Exercises: Seated   Neck Retraction  10 reps    Other Seated Exercise  shoulder pulley  x20 (flexion) for posture and mechanics wiht AROM.       Neck Exercises:  Supine   Neck Retraction  --      Modalities   Modalities  Moist Heat      Moist Heat Therapy   Moist Heat Location  --      Manual Therapy   Manual Therapy  Soft tissue mobilization;Joint mobilization    Manual therapy comments  --    Joint Mobilization  Cervical PA mobs, gr 3;  Side glides ;     Soft tissue mobilization  Manual distraction 10 sec x8;  DTM to Bil UT and cervical paraspinals;       Neck Exercises: Stretches   Upper Trapezius Stretch  30 seconds;2 reps    Levator Stretch  30 seconds;2 reps               PT Short Term Goals - 12/29/17 2144      PT SHORT TERM GOAL #1   Title  Pt to report decreased pain in L cervical region to 2/10  Time  2    Period  Weeks    Status  New    Target Date  01/08/18      PT SHORT TERM GOAL #2   Title  Pt to be independent with initial HEP    Time  2    Period  Weeks    Status  New    Target Date  01/08/18        PT Long Term Goals - 12/29/17 2145      PT LONG TERM GOAL #1   Title  Pt to demo improved cervical ROM, to be WNL , to improve ability for driving and work duties.     Time  6    Period  Weeks    Status  New    Target Date  02/05/18      PT LONG TERM GOAL #2   Title  Pt to report decreased pain in cervical region to 0-1/10 with palpation, and with AROM, to improve ability for IADLs.     Time  6    Period  Weeks    Status  New    Target Date  02/05/18      PT LONG TERM GOAL #3   Title  Pt to be independent with final HEP for ROM, strength, and posture.     Time  6    Period  Weeks    Status  New    Target Date  02/05/18            Plan - 01/02/18 2200    Clinical Impression Statement  Pt with improving cervical ROM, especially for rotation. He has mild pain in L UT region with rotation and extension. Pt requires cueing with stabilization exercises today , for mechanics, and postural awareness. Pt to benefit from continued care.     Rehab Potential  Good    PT Frequency  2x / week     PT Duration  6 weeks    PT Treatment/Interventions  ADLs/Self Care Home Management;Cryotherapy;Electrical Stimulation;Iontophoresis 4mg /ml Dexamethasone;Moist Heat;Therapeutic activities;Functional mobility training;Ultrasound;Therapeutic exercise;Traction;Neuromuscular re-education;Patient/family education;Dry needling;Passive range of motion;Manual techniques;Taping    Consulted and Agree with Plan of Care  Patient       Patient will benefit from skilled therapeutic intervention in order to improve the following deficits and impairments:  Hypomobility, Decreased activity tolerance, Pain, Increased muscle spasms, Decreased mobility, Decreased range of motion, Improper body mechanics  Visit Diagnosis: Neck pain     Problem List Patient Active Problem List   Diagnosis Date Noted  . Hyperlipidemia 11/19/2016  . L4-L5 disc bulge     Lyndee Hensen, PT, DPT 10:01 PM  01/02/18    [facility] [place], [place], [address] Phone: [phone]   Fax:  [phone]  Name: Ronald Tyler MRN: [id number] Date of Birth: March 03, 1976

## 2018-01-06 ENCOUNTER — Ambulatory Visit: Payer: 59 | Admitting: Physical Therapy

## 2018-01-06 DIAGNOSIS — M542 Cervicalgia: Secondary | ICD-10-CM

## 2018-01-07 ENCOUNTER — Encounter: Payer: Self-pay | Admitting: Physical Therapy

## 2018-01-07 NOTE — Therapy (Signed)
[facility] [address] [place], [place], [address] Phone: [phone]   Fax:  [phone]  Physical Therapy Treatment  Patient Details  Name: Ronald Tyler MRN: [id number] Date of Birth: 05-Mar-1976 Referring Provider: Garret Reddish   Encounter Date: 01/06/2018  PT End of Session - 01/07/18 1023    Visit Number  4    Number of Visits  12    Date for PT Re-Evaluation  02/05/18    PT Start Time  9983    PT Stop Time  1650    PT Time Calculation (min)  35 min    Activity Tolerance  Patient tolerated treatment well    Behavior During Therapy  [facility] for tasks assessed/performed       Past Medical History:  Diagnosis Date  . L4-L5 disc bulge    Pt states he has rec'd 2 injections 6 months apart, last one was 10/28/15  . MRSA infection    infected hair follice in nose. now cleared    Past Surgical History:  Procedure Laterality Date  . HERNIA REPAIR Bilateral    left 1986, right 2005    There were no vitals filed for this visit.  Subjective Assessment - 01/07/18 1022    Subjective  Pt staets pain is significantly improved. He still has mild pain with L and R rotation. Pt 15  min late to appt today.     Currently in Pain?  Yes    Pain Score  2     Pain Location  Neck    Pain Orientation  Left    Pain Descriptors / Indicators  Aching;Tightness    Pain Type  Acute pain    Pain Onset  More than a month ago    Pain Frequency  Intermittent                       [facility] Adult PT Treatment/Exercise - 01/07/18 0001      Neck Exercises: Standing   Other Standing Exercises  Rows BlTB x30; Low Row GTB x20;  Bil ER GTB x25;     Other Standing Exercises  Scap pull outs GTB x20;       Neck Exercises: Seated   Cervical Rotation  10 reps      Manual Therapy   Manual therapy comments  skilled palpation and monitoring of soft tissue during DN    Joint Mobilization  Cervical PA mobs, gr 3;  Side glides ;     Soft  tissue mobilization  Manual distraction 10 sec x8;  IASTM/DTM to L UT and L rhomboid ;       Neck Exercises: Stretches   Upper Trapezius Stretch  30 seconds;2 reps    Levator Stretch  30 seconds;2 reps       Trigger Point Dry Needling - 01/07/18 1031    Consent Given?  Yes    Muscles Treated Upper Body  Upper trapezius;Levator scapulae Left    Upper Trapezius Response  Twitch reponse elicited;Palpable increased muscle length    Levator Scapulae Response  Twitch response elicited;Palpable increased muscle length           PT Education - 01/07/18 1023    Education Details  HEP reviewed     Person(s) Educated  Patient    Methods  Explanation    Comprehension  Verbalized understanding       PT Short Term Goals - 01/07/18 1027      PT  SHORT TERM GOAL #1   Title  Pt to report decreased pain in L cervical region to 2/10     Time  2    Period  Weeks    Status  Achieved      PT SHORT TERM GOAL #2   Title  Pt to be independent with initial HEP    Time  2    Period  Weeks    Status  Achieved        PT Long Term Goals - 12/29/17 2145      PT LONG TERM GOAL #1   Title  Pt to demo improved cervical ROM, to be WNL , to improve ability for driving and work duties.     Time  6    Period  Weeks    Status  New    Target Date  02/05/18      PT LONG TERM GOAL #2   Title  Pt to report decreased pain in cervical region to 0-1/10 with palpation, and with AROM, to improve ability for IADLs.     Time  6    Period  Weeks    Status  New    Target Date  02/05/18      PT LONG TERM GOAL #3   Title  Pt to be independent with final HEP for ROM, strength, and posture.     Time  6    Period  Weeks    Status  New    Target Date  02/05/18            Plan - 01/07/18 1028    Clinical Impression Statement  Pt with good response to dry needling today, and significant reduction in pain. Pt able to perform pain free cervcial ROM today. Pt educated on importance of posture and correct  posture with ther ex. Pt will be put on hold at this time ,will return if he has further problems or increased pain in next 2-3 weeks.     Rehab Potential  Good    PT Frequency  2x / week    PT Duration  6 weeks    PT Treatment/Interventions  ADLs/Self Care Home Management;Cryotherapy;Electrical Stimulation;Iontophoresis 4mg /ml Dexamethasone;Moist Heat;Therapeutic activities;Functional mobility training;Ultrasound;Therapeutic exercise;Traction;Neuromuscular re-education;Patient/family education;Dry needling;Passive range of motion;Manual techniques;Taping    Consulted and Agree with Plan of Care  Patient       Patient will benefit from skilled therapeutic intervention in order to improve the following deficits and impairments:  Hypomobility, Decreased activity tolerance, Pain, Increased muscle spasms, Decreased mobility, Decreased range of motion, Improper body mechanics  Visit Diagnosis: Neck pain     Problem List Patient Active Problem List   Diagnosis Date Noted  . Hyperlipidemia 11/19/2016  . L4-L5 disc bulge    Lyndee Hensen, PT, DPT 10:33 AM  01/07/18    [facility] [place], [place], [address] Phone: [phone]   Fax:  [phone]  Name: Ronald Tyler MRN: [id number] Date of Birth: 11/02/1975

## 2018-01-09 ENCOUNTER — Encounter: Payer: 59 | Admitting: Physical Therapy

## 2018-01-16 ENCOUNTER — Ambulatory Visit (INDEPENDENT_AMBULATORY_CARE_PROVIDER_SITE_OTHER): Payer: 59 | Admitting: Physical Therapy

## 2018-01-16 DIAGNOSIS — M542 Cervicalgia: Secondary | ICD-10-CM | POA: Diagnosis not present

## 2018-01-20 ENCOUNTER — Encounter: Payer: Self-pay | Admitting: Physical Therapy

## 2018-01-20 NOTE — Therapy (Addendum)
[facility] [address] [place], [place], [address] Phone: [phone]   Fax:  [phone]  Physical Therapy Treatment  Patient Details  Name: Ronald Tyler MRN: [id number] Date of Birth: 10-02-1975 Referring Provider: Garret Reddish   Encounter Date: 01/16/2018  PT End of Session - 01/20/18 1030    Visit Number  5    Number of Visits  12    Date for PT Re-Evaluation  02/05/18    PT Start Time  1600    PT Stop Time  1644    PT Time Calculation (min)  44 min    Activity Tolerance  Patient tolerated treatment well    Behavior During Therapy  [facility] for tasks assessed/performed       Past Medical History:  Diagnosis Date  . L4-L5 disc bulge    Pt states he has rec'd 2 injections 6 months apart, last one was 10/28/15  . MRSA infection    infected hair follice in nose. now cleared    Past Surgical History:  Procedure Laterality Date  . HERNIA REPAIR Bilateral    left 1986, right 2005    There were no vitals filed for this visit.  Subjective Assessment - 01/20/18 1029    Subjective  Pt states that he has been working out. He states improvements, but has increased pain on L, with cervical rotation to R.     Currently in Pain?  Yes    Pain Score  2     Pain Location  Neck    Pain Orientation  Left    Pain Descriptors / Indicators  Tightness;Aching    Pain Type  Acute pain    Pain Onset  More than a month ago    Pain Frequency  Intermittent                       [facility] Adult PT Treatment/Exercise - 01/20/18 0001      Neck Exercises: Standing   Other Standing Exercises  Rows BlTB x30; Low Row GTB x20;  Bil ER GTB x25;     Other Standing Exercises  Scap pull outs GTB x20;  UE scaption 3 lb bil x20;  Prone T x20;       Neck Exercises: Seated   Neck Retraction  10 reps    Cervical Rotation  10 reps      Manual Therapy   Manual therapy comments  skilled palpation and monitoring of soft tissue during  DN    Soft tissue mobilization   IASTM/DTM to L UT , levator, and L rhomboid ;        Neck Exercises: Stretches   Upper Trapezius Stretch  30 seconds;2 reps    Levator Stretch  30 seconds;2 reps    Corner Stretch  3 reps;30 seconds       Trigger Point Dry Needling - 01/20/18 1037    Consent Given?  Yes    Muscles Treated Upper Body  Upper trapezius;Levator scapulae    Upper Trapezius Response  Twitch reponse elicited;Palpable increased muscle length    Levator Scapulae Response  Twitch response elicited;Palpable increased muscle length           PT Education - 01/20/18 1030    Education Details  HEP reviewed.     Person(s) Educated  Patient    Methods  Explanation    Comprehension  Verbalized understanding       PT Short Term Goals - 01/07/18  [place] #1   Title  Pt to report decreased pain in L cervical region to 2/10     Time  2    Period  Weeks    Status  Achieved      PT SHORT TERM GOAL #2   Title  Pt to be independent with initial HEP    Time  2    Period  Weeks    Status  Achieved        PT Long Term Goals - 01/20/18 1030      PT LONG TERM GOAL #1   Title  Pt to demo improved cervical ROM, to be WNL , to improve ability for driving and work duties.     Time  6    Period  Weeks    Status  Achieved      PT LONG TERM GOAL #2   Title  Pt to report decreased pain in cervical region to 0-1/10 with palpation, and with AROM, to improve ability for IADLs.     Time  6    Period  Weeks    Status  Achieved      PT LONG TERM GOAL #3   Title  Pt to be independent with final HEP for ROM, strength, and posture.     Time  6    Period  Weeks    Status  Achieved            Plan - 01/20/18 1031    Clinical Impression Statement  Pt returns today with increased sorness in neck. Dry needling done today for L levator, pt with improved cervical rotation and pain with rotation after session today. Pt has met gaols at this time. Pt will be put  on hold , will only return with increased symptoms, otherwise will d/c in 2-3 weeks. Discussed safe and effective return to activity, and discussed final HEp in detail.     Rehab Potential  Good    PT Frequency  2x / week    PT Duration  6 weeks    PT Treatment/Interventions  ADLs/Self Care Home Management;Cryotherapy;Electrical Stimulation;Iontophoresis 36m/ml Dexamethasone;Moist Heat;Therapeutic activities;Functional mobility training;Ultrasound;Therapeutic exercise;Traction;Neuromuscular re-education;Patient/family education;Dry needling;Passive range of motion;Manual techniques;Taping    Consulted and Agree with Plan of Care  Patient       Patient will benefit from skilled therapeutic intervention in order to improve the following deficits and impairments:  Hypomobility, Decreased activity tolerance, Pain, Increased muscle spasms, Decreased mobility, Decreased range of motion, Improper body mechanics  Visit Diagnosis: Neck pain     Problem List Patient Active Problem List   Diagnosis Date Noted  . Hyperlipidemia 11/19/2016  . L4-L5 disc bulge     LLyndee Hensen PT, DPT 10:37 AM  01/20/18    [facility] [place] [address]Phone: [phone]  Fax:  [phone] Name: Ronald WaasMRN: [id number] of Birth: [birth year]   PHYSICAL THERAPY DISCHARGE SUMMARY  Visits from Start of Care:5   Plan: Patient agrees to discharge.  Patient goals were met. Patient is being discharged due to meeting the stated rehab goals.  ?????      LLyndee Hensen PT, DPT 12:55 PM  04/01/18

## 2018-02-27 DIAGNOSIS — M5136 Other intervertebral disc degeneration, lumbar region: Secondary | ICD-10-CM | POA: Diagnosis not present

## 2018-03-28 ENCOUNTER — Ambulatory Visit (INDEPENDENT_AMBULATORY_CARE_PROVIDER_SITE_OTHER): Payer: 59

## 2018-03-28 DIAGNOSIS — Z23 Encounter for immunization: Secondary | ICD-10-CM | POA: Diagnosis not present

## 2018-03-28 NOTE — Patient Instructions (Signed)
There are no preventive care reminders to display for this patient.  Depression screen [facility] 2/9 05/22/2017 11/04/2015  Decreased Interest 0 0  Down, Depressed, Hopeless 0 0  PHQ - 2 Score 0 0

## 2018-03-28 NOTE — Progress Notes (Signed)
Patient here today for Flu vaccine. VIS given. Administered in left arm. Tolerated well

## 2018-04-11 ENCOUNTER — Ambulatory Visit: Payer: 59 | Admitting: Family Medicine

## 2018-04-11 ENCOUNTER — Encounter: Payer: Self-pay | Admitting: Family Medicine

## 2018-04-11 VITALS — BP 104/82 | HR 76 | Temp 97.5°F | Ht 71.75 in | Wt 201.0 lb

## 2018-04-11 DIAGNOSIS — H9202 Otalgia, left ear: Secondary | ICD-10-CM | POA: Diagnosis not present

## 2018-04-11 NOTE — Progress Notes (Signed)
Subjective:  Ronald Tyler is a 42 y.o. year old very pleasant male patient who presents for/with See problem oriented charting ROS-no hearing loss or tinnitus.  No sinus congestion.  No sneezing.  Past Medical History-  Patient Active Problem List   Diagnosis Date Noted  . Hyperlipidemia 11/19/2016  . L4-L5 disc bulge     Medications- reviewed and updated Current Outpatient Medications  Medication Sig Dispense Refill  . methocarbamol (ROBAXIN) 750 MG tablet Take 1 tablet by mouth 4 (four) times daily as needed.  0   No current facility-administered medications for this visit.     Objective: BP 104/82 (BP Location: Left Arm, Patient Position: Sitting, Cuff Size: Large)   Pulse 76   Temp (!) 97.5 F (36.4 C) (Oral)   Ht 5' 11.75" (1.822 m)   Wt 201 lb (91.2 kg)   SpO2 97%   BMI 27.45 kg/m  Gen: NAD, resting comfortably Right tympanic membrane normal.  In front of left tympanic membrane there are 2 hairs noted.   CV: RRR  Lungs: nonlabored, normal respiratory rate Abdomen: soft/nondistended   Assessment/Plan:  Discomfort of left ear S: Patient has noted for the last week a tapping sensation in his left ear worse with exercise.  He actually has an application on his phone with an otoscope and he can see hairs in front of his tympanic membrane.  Sensation is only in mild discomfort.  He actually feels like it is getting slightly worse. A/P: Irrigation provided in the left ear with removal of actually several hairs.  It appears this was the source of his discomfort.  Patient felt improvement of his symptoms afterwards.  He knows to follow-up with [place] if this does not resolve his symptoms.  Return precautions advised.  Garret Reddish, MD

## 2018-06-12 ENCOUNTER — Telehealth: Payer: Self-pay

## 2018-06-12 NOTE — Telephone Encounter (Signed)
Spoke with patient who states he had another episode of bleeding yesterday. He is going to complete cards in a couple of weeks as he was advised to wait a couple of weeks after seeing blood.

## 2018-06-12 NOTE — Telephone Encounter (Signed)
Sounds good thanks for updat e

## 2018-06-12 NOTE — Telephone Encounter (Signed)
-----   Message from Marin Olp, MD sent at 06/09/2018  8:46 PM EST ----- MI missing something or did Brad never complete stool cards?Thanks, Annie Main ----- Message ----- From: SYSTEM Sent: 06/08/2018  12:08 AM EST To: Marin Olp, MD

## 2018-10-17 DIAGNOSIS — L821 Other seborrheic keratosis: Secondary | ICD-10-CM | POA: Diagnosis not present

## 2018-10-17 DIAGNOSIS — D2271 Melanocytic nevi of right lower limb, including hip: Secondary | ICD-10-CM | POA: Diagnosis not present

## 2018-10-17 DIAGNOSIS — D2262 Melanocytic nevi of left upper limb, including shoulder: Secondary | ICD-10-CM | POA: Diagnosis not present

## 2019-07-31 ENCOUNTER — Encounter: Payer: Self-pay | Admitting: Family Medicine

## 2020-02-28 NOTE — Progress Notes (Signed)
Phone: [phone]    Subjective:  Patient presents today for their annual physical. Chief complaint-noted.   See problem oriented charting- ROS- full  review of systems was completed and negative  except for: back pain, fatigue, shortness of breath- since covid months ago  The following were reviewed and entered/updated in epic: Past Medical History:  Diagnosis Date   L4-L5 disc bulge    Pt states he has rec'd 2 injections 6 months apart, last one was 10/28/15   MRSA infection    infected hair follice in nose. now cleared   Patient Active Problem List   Diagnosis Date Noted   Hyperlipidemia 11/19/2016   L4-L5 disc bulge    Past Surgical History:  Procedure Laterality Date   HERNIA REPAIR Bilateral    left 1986, right 2005    Family History  Problem Relation Age of Onset   Hypertension Father    Diabetes Father    Prostate cancer Father        age 71   Diabetes Maternal Grandmother    Stroke Maternal Grandfather    Breast cancer Paternal Grandmother    Heart disease Paternal Grandfather 58       smoker    Medications- reviewed and updated Current Outpatient Medications  Medication Sig Dispense Refill   methocarbamol (ROBAXIN) 750 MG tablet Take 1 tablet by mouth as needed.   0   No current facility-administered medications for this visit.    Allergies-reviewed and updated No Known Allergies  Social History   Social History Narrative   Single. Have a partner- 9 years. May or may not get married. No kids.    Partner's brother also lives with them.       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    College at [employer]: loves working (states it his hobby) also Financial planner      Objective:  BP 118/82    Pulse 97    Temp 98.1 F (36.7 C) (Temporal)    Resp 18    Ht 6' (1.829 m)    Wt 202 lb 9.6 oz (91.9 kg)    SpO2 98%    BMI 27.48 kg/m  Gen: NAD, resting comfortably HEENT: Mucous  membranes are moist. Oropharynx normal Neck: no thyromegaly CV: RRR no murmurs rubs or gallops Lungs: CTAB no crackles, wheeze, rhonchi Abdomen: soft/nontender/nondistended/normal bowel sounds. No rebound or guarding.  Ext: no edema Skin: warm, dry Neuro: grossly normal, moves all extremities, PERRLA     Assessment and Plan:  44 y.o. male presenting for annual physical.  Health Maintenance counseling: 1. Anticipatory guidance: Patient counseled regarding regular dental exams q6 months advised, eye exams -yearly due to lasik eye surgery advised,  avoiding smoking and second hand smoke, limiting alcohol to 2 beverages per day.   2. Risk factor reduction:  Advised patient of need for regular exercise and diet rich and fruits and vegetables to reduce risk of heart attack and stroke. Exercise- 3 days a week at least. Diet- tough time a year weight wise for him- 198 on home scales. Last CPE 2018 was 187- wants to get to 190 leaned out- has added muscle mass Wt Readings from Last 3 Encounters:  02/29/20 202 lb 9.6 oz (91.9 kg)  04/11/18 201 lb (91.2 kg)  11/13/17 192 lb (87.1 kg)  3. Immunizations/screenings/ancillary studies- hep c screen today . Flu shot - today. covid 19 vaccine - already had Immunization History  Administered Date(s) Administered   Hep A / Hep B 11/16/2016, 12/20/2016, 05/22/2017   Influenza,inj,Quad PF,6+ Mos 05/22/2017, 03/28/2018   Influenza-Unspecified 03/28/2016   PFIZER SARS-COV-2 Vaccination 10/17/2019, 11/07/2019   Tdap 11/04/2015  4. Prostate cancer screening-   Family history in father at 24, he has requested to start screening at age 67. Declines rectal for now  5. Colon cancer screening -  no family history, start at age 56 6. Skin cancer screening/prevention- Dr. Yates [facility] in may.  advised regular sunscreen use. Denies worrisome, changing, or new skin lesions.  7. Testicular cancer screening- advised monthly self exams  8. STD  screening- patient opts - Last screened 2018 and monogamous since that time 85. Never smoker  Status of chronic or acute concerns   # Fatigue Since covid at end of February/march he has not felt the same. Ongoing fatigue and and shortness of breath since that time. Occasional palpitations. Doesn't feel the benefits of his good deep breaths - oxygen levels have been 96-97%. No improvement after vaccination.  - will evaluate labs as below  #hyperlipidemia- ascvd risk has been below 7.5% S: Medication: none  A/P: will update with labs today- unlikely to start statin unless significant worsening  # Low back pain/bulging disc S: has seen Dr. Nelva Bush in past about every 8 months - 11 months most recently . As needed robaxin rarely A/P: stable- continue follow up Dr. Nelva Bush   #family history of diabetes in father- screen cbg yearly   Recommended follow up: Return in about 1 year (around 02/28/2021) for physical or sooner if needed.  Lab/Order associations: will come back fatsing   ICD-10-CM   1. Preventative health care  Z00.00 Hepatitis C antibody    COMPLETE METABOLIC PANEL WITH GFR    Lipid panel    PSA    CANCELED: CBC  2. Hyperlipidemia, unspecified hyperlipidemia type  Z12.4 COMPLETE METABOLIC PANEL WITH GFR    Lipid panel    CBC With Differential/Platelet    CANCELED: CBC  3. Encounter for hepatitis C screening test for low risk patient  Z11.59 Hepatitis C antibody  4. Screening for prostate cancer  Z12.5 PSA  5. Fatigue, unspecified type  R53.83 Vitamin B12    VITAMIN D 25 Hydroxy (Vit-D Deficiency, Fractures)    TSH    Testos,Total,Free and SHBG (Male)    No orders of the defined types were placed in this encounter.   Return precautions advised.   Garret Reddish, MD

## 2020-02-28 NOTE — Patient Instructions (Addendum)
Health Maintenance Due  Topic Date Due   INFLUENZA VACCINE In office flu shot  01/03/2020   Schedule a lab visit at the check out desk within 2 weeks. Return for future fasting labs meaning nothing but water after midnight please. Ok to take your medications with water.  HAS to be between 8-9 Am for testosterone level to be accurate.   Get updated dental and eye exam

## 2020-02-29 ENCOUNTER — Ambulatory Visit (INDEPENDENT_AMBULATORY_CARE_PROVIDER_SITE_OTHER): Payer: 59 | Admitting: Family Medicine

## 2020-02-29 ENCOUNTER — Encounter: Payer: Self-pay | Admitting: Family Medicine

## 2020-02-29 ENCOUNTER — Other Ambulatory Visit: Payer: Self-pay

## 2020-02-29 VITALS — BP 118/82 | HR 97 | Temp 98.1°F | Resp 18 | Ht 72.0 in | Wt 202.6 lb

## 2020-02-29 DIAGNOSIS — E785 Hyperlipidemia, unspecified: Secondary | ICD-10-CM | POA: Diagnosis not present

## 2020-02-29 DIAGNOSIS — Z0000 Encounter for general adult medical examination without abnormal findings: Secondary | ICD-10-CM

## 2020-02-29 DIAGNOSIS — Z1159 Encounter for screening for other viral diseases: Secondary | ICD-10-CM

## 2020-02-29 DIAGNOSIS — Z23 Encounter for immunization: Secondary | ICD-10-CM | POA: Diagnosis not present

## 2020-02-29 DIAGNOSIS — Z125 Encounter for screening for malignant neoplasm of prostate: Secondary | ICD-10-CM | POA: Diagnosis not present

## 2020-02-29 DIAGNOSIS — R5383 Other fatigue: Secondary | ICD-10-CM

## 2020-02-29 NOTE — Addendum Note (Signed)
Addended by: Thomes Cake on: 02/29/2020 10:03 AM   Modules accepted: Orders

## 2020-03-01 ENCOUNTER — Other Ambulatory Visit: Payer: 59

## 2020-03-01 DIAGNOSIS — R5383 Other fatigue: Secondary | ICD-10-CM

## 2020-03-01 DIAGNOSIS — Z0000 Encounter for general adult medical examination without abnormal findings: Secondary | ICD-10-CM

## 2020-03-01 DIAGNOSIS — E785 Hyperlipidemia, unspecified: Secondary | ICD-10-CM

## 2020-03-01 DIAGNOSIS — Z1159 Encounter for screening for other viral diseases: Secondary | ICD-10-CM

## 2020-03-01 DIAGNOSIS — Z125 Encounter for screening for malignant neoplasm of prostate: Secondary | ICD-10-CM

## 2020-03-04 LAB — LIPID PANEL
Cholesterol: 167 mg/dL (ref <200)
HDL: 40 mg/dL (ref >=40)
LDL Cholesterol (Calc): 100 mg/dL (calc) — ABNORMAL HIGH
Non-HDL Cholesterol (Calc): 127 mg/dL (calc) (ref <130)
Total CHOL/HDL Ratio: 4.2 (calc) (ref <5.0)
Triglycerides: 172 mg/dL — ABNORMAL HIGH (ref <150)

## 2020-03-04 LAB — COMPLETE METABOLIC PANEL WITH GFR
AG Ratio: 1.9 (calc) (ref 1.0–2.5)
ALT: 30 U/L (ref 9–46)
AST: 17 U/L (ref 10–40)
Albumin: 4.1 g/dL (ref 3.6–5.1)
Alkaline phosphatase (APISO): 58 U/L (ref 36–130)
BUN: 8 mg/dL (ref 7–25)
CO2: 28 mmol/L (ref 20–32)
Calcium: 9 mg/dL (ref 8.6–10.3)
Chloride: 104 mmol/L (ref 98–110)
Creat: 0.92 mg/dL (ref 0.60–1.35)
GFR, Est African American: 119 mL/min/{1.73_m2} (ref >=60)
GFR, Est Non African American: 103 mL/min/{1.73_m2} (ref >=60)
Globulin: 2.2 g/dL (calc) (ref 1.9–3.7)
Glucose, Bld: 94 mg/dL (ref 65–99)
Potassium: 4.2 mmol/L (ref 3.5–5.3)
Sodium: 137 mmol/L (ref 135–146)
Total Bilirubin: 1 mg/dL (ref 0.2–1.2)
Total Protein: 6.3 g/dL (ref 6.1–8.1)

## 2020-03-04 LAB — CBC WITH DIFFERENTIAL/PLATELET
Absolute Monocytes: 440 cells/uL (ref 200–950)
Basophils Absolute: 22 cells/uL (ref 0–200)
Basophils Relative: 0.5 %
Eosinophils Absolute: 88 cells/uL (ref 15–500)
Eosinophils Relative: 2 %
HCT: 47 % (ref 38.5–50.0)
Hemoglobin: 15.8 g/dL (ref 13.2–17.1)
Lymphs Abs: 1478 cells/uL (ref 850–3900)
MCH: 29.8 pg (ref 27.0–33.0)
MCHC: 33.6 g/dL (ref 32.0–36.0)
MCV: 88.7 fL (ref 80.0–100.0)
MPV: 9.9 fL (ref 7.5–12.5)
Monocytes Relative: 10 %
Neutro Abs: 2372 cells/uL (ref 1500–7800)
Neutrophils Relative %: 53.9 %
Platelets: 228 10*3/uL (ref 140–400)
RBC: 5.3 10*6/uL (ref 4.20–5.80)
RDW: 12.3 % (ref 11.0–15.0)
Total Lymphocyte: 33.6 %
WBC: 4.4 10*3/uL (ref 3.8–10.8)

## 2020-03-04 LAB — TESTOS,TOTAL,FREE AND SHBG (FEMALE)
Free Testosterone: 63.9 pg/mL (ref 35.0–155.0)
Sex Hormone Binding: 27 nmol/L (ref 10–50)
Testosterone, Total, LC-MS-MS: 353 ng/dL (ref 250–1100)

## 2020-03-04 LAB — VITAMIN B12: Vitamin B-12: 464 pg/mL (ref 200–1100)

## 2020-03-04 LAB — TSH: TSH: 1.9 mIU/L (ref 0.40–4.50)

## 2020-03-04 LAB — HEPATITIS C ANTIBODY
Hepatitis C Ab: NONREACTIVE
SIGNAL TO CUT-OFF: 0.01 (ref <1.00)

## 2020-03-04 LAB — PSA: PSA: 0.69 ng/mL (ref <=4.0)

## 2020-03-04 LAB — VITAMIN D 25 HYDROXY (VIT D DEFICIENCY, FRACTURES): Vit D, 25-Hydroxy: 27 ng/mL — ABNORMAL LOW (ref 30–100)

## 2020-03-06 ENCOUNTER — Encounter: Payer: Self-pay | Admitting: Family Medicine

## 2020-04-05 IMAGING — DX DG ANKLE COMPLETE 3+V*R*
3 series · 3 of 3 positions shown · non-contrast
Comparison: None.

CLINICAL DATA: Pain following twisting injury

EXAM:
RIGHT ANKLE - COMPLETE 3+ VIEW

[ankle ap]
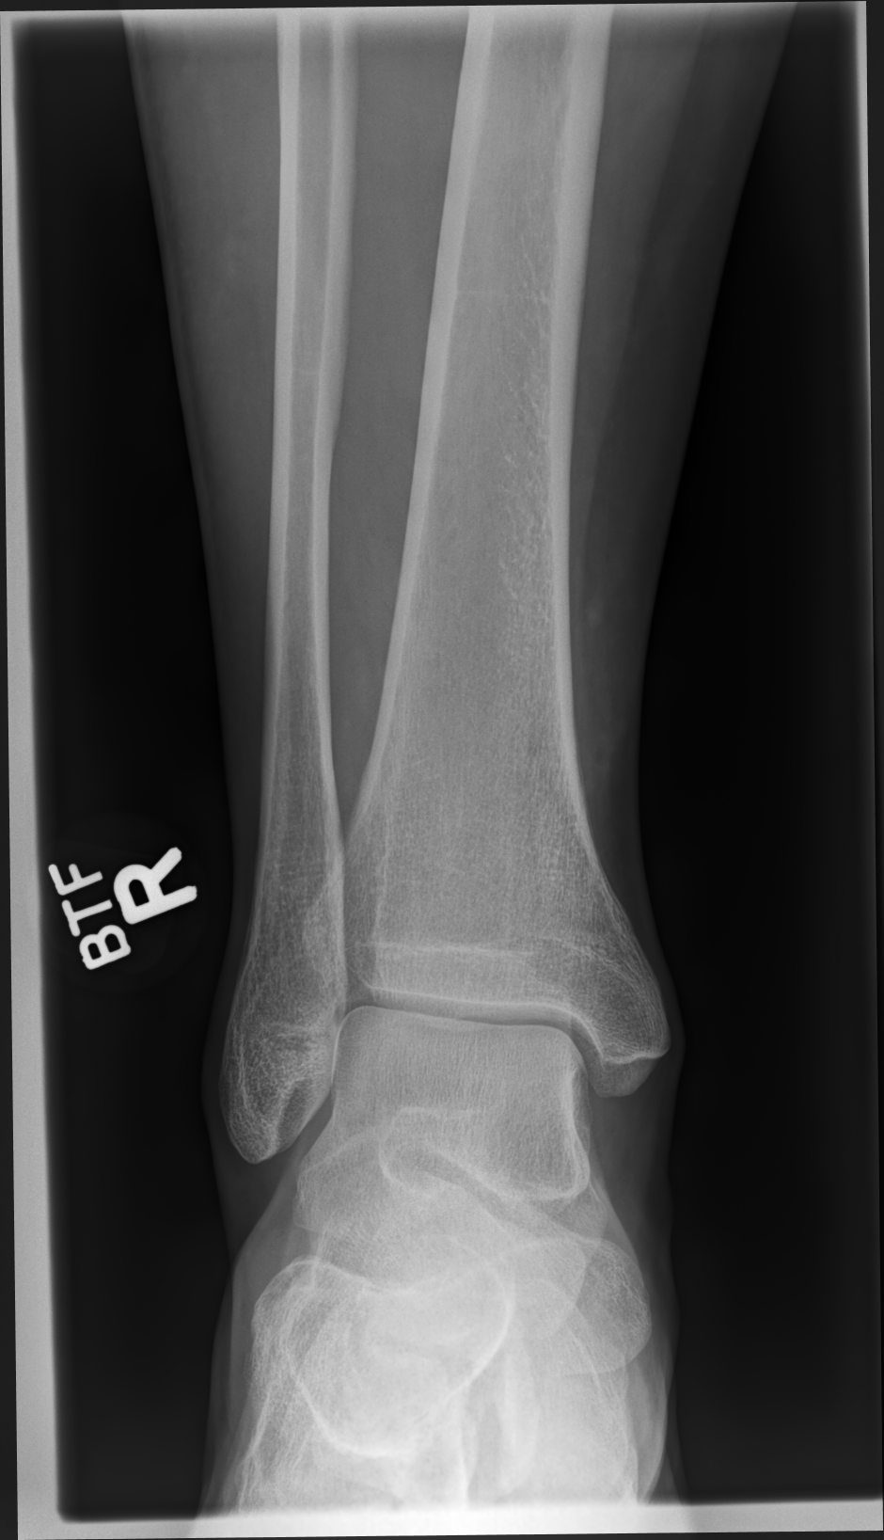

[ankle oblique]
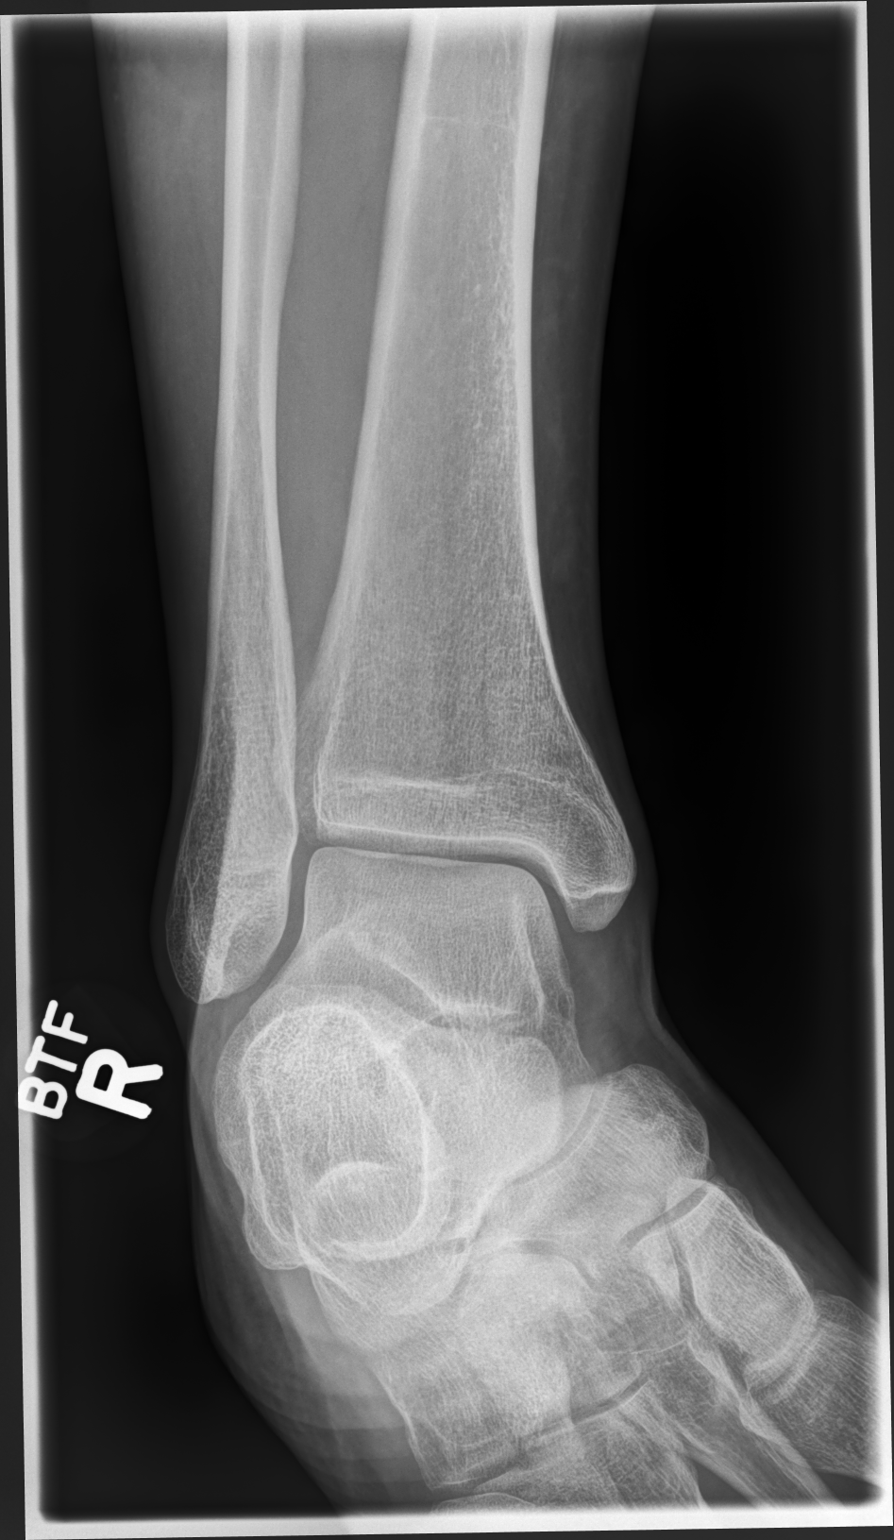

[ankle lat]
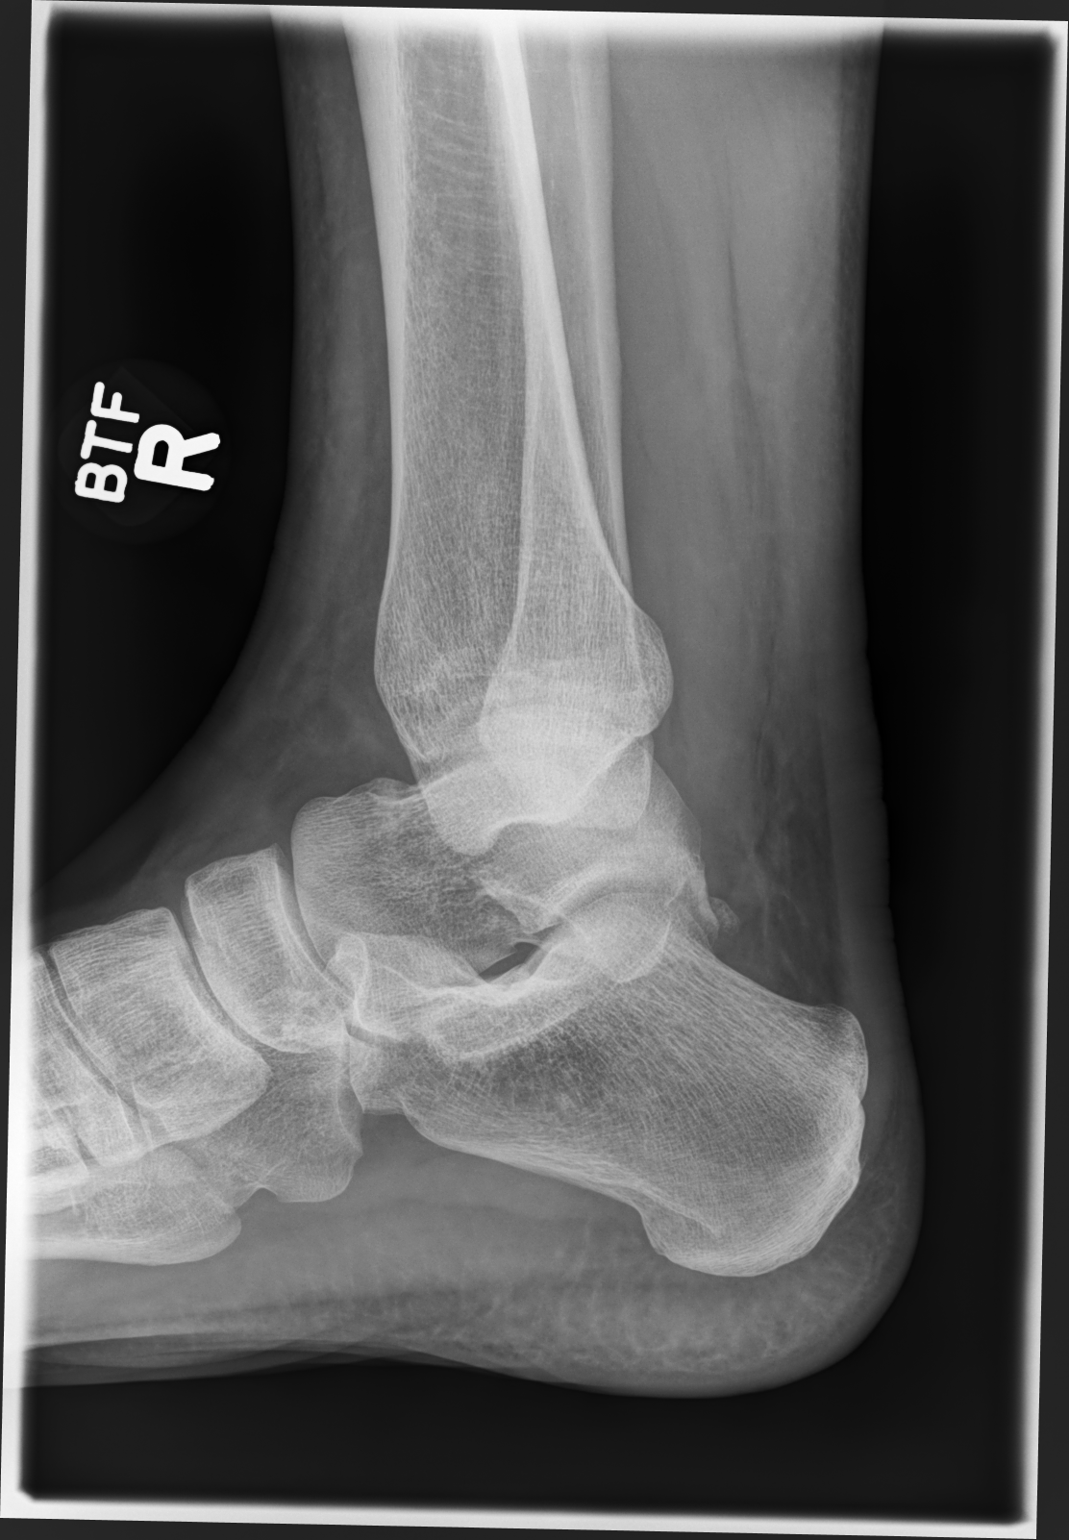

[3 of 3 positions shown; findings below may reference images not displayed]

FINDINGS: Frontal, oblique, and lateral views obtained. No evident fracture or
joint effusion. There is soft tissue swelling. No appreciable joint
space narrowing or erosion. Ankle mortise appears intact.
IMPRESSION: Soft tissue swelling. No evident fracture or appreciable
arthropathy. Ankle mortise appears intact.

## 2020-08-24 ENCOUNTER — Encounter: Payer: Self-pay | Admitting: Family Medicine

## 2020-08-24 NOTE — Telephone Encounter (Signed)
Dr. Yong Channel, I have sent an email to [facility] with [facility] to investigate.    B12 and Vit D is not covered by fatigue.

## 2020-09-16 NOTE — Telephone Encounter (Signed)
Is there anything I need to do in regard to this?

## 2020-09-19 NOTE — Telephone Encounter (Signed)
Dawn is going to look into this with [employer].

## 2020-09-22 NOTE — Telephone Encounter (Signed)
I have left patient vm to give me a call back in regard.    [employer] has resubmitted claim to [facility].   If patient receives any bills after resubmission, he can reach back out to [place] with updated EOB's for review.

## 2021-10-05 ENCOUNTER — Other Ambulatory Visit (HOSPITAL_COMMUNITY)
Admission: RE | Admit: 2021-10-05 | Discharge: 2021-10-05 | Disposition: A | Discharge status: Home / Self Care | Payer: 59 | Source: Ambulatory Visit | Attending: Family Medicine | Admitting: Family Medicine

## 2021-10-05 ENCOUNTER — Encounter: Payer: Self-pay | Admitting: Family Medicine

## 2021-10-05 ENCOUNTER — Telehealth: Payer: Self-pay

## 2021-10-05 ENCOUNTER — Ambulatory Visit (INDEPENDENT_AMBULATORY_CARE_PROVIDER_SITE_OTHER): Payer: 59 | Admitting: Family Medicine

## 2021-10-05 VITALS — BP 100/62 | HR 85 | Temp 98.4°F | Ht 72.0 in | Wt 190.0 lb

## 2021-10-05 DIAGNOSIS — E785 Hyperlipidemia, unspecified: Secondary | ICD-10-CM

## 2021-10-05 DIAGNOSIS — Z125 Encounter for screening for malignant neoplasm of prostate: Secondary | ICD-10-CM

## 2021-10-05 DIAGNOSIS — Z113 Encounter for screening for infections with a predominantly sexual mode of transmission: Secondary | ICD-10-CM | POA: Insufficient documentation

## 2021-10-05 DIAGNOSIS — Z114 Encounter for screening for human immunodeficiency virus [HIV]: Secondary | ICD-10-CM

## 2021-10-05 DIAGNOSIS — E559 Vitamin D deficiency, unspecified: Secondary | ICD-10-CM | POA: Diagnosis not present

## 2021-10-05 DIAGNOSIS — Z118 Encounter for screening for other infectious and parasitic diseases: Secondary | ICD-10-CM

## 2021-10-05 DIAGNOSIS — Z0000 Encounter for general adult medical examination without abnormal findings: Secondary | ICD-10-CM

## 2021-10-05 LAB — LIPID PANEL
Cholesterol: 178 mg/dL (ref 0–200)
HDL: 45.6 mg/dL (ref >39.00)
LDL Cholesterol: 112 mg/dL — ABNORMAL HIGH (ref 0–99)
NonHDL: 132.78
Total CHOL/HDL Ratio: 4
Triglycerides: 104 mg/dL (ref 0.0–149.0)
VLDL: 20.8 mg/dL (ref 0.0–40.0)

## 2021-10-05 LAB — COMPREHENSIVE METABOLIC PANEL
ALT: 36 U/L (ref 0–53)
AST: 22 U/L (ref 0–37)
Albumin: 4.6 g/dL (ref 3.5–5.2)
Alkaline Phosphatase: 66 U/L (ref 39–117)
BUN: 8 mg/dL (ref 6–23)
CO2: 29 mEq/L (ref 19–32)
Calcium: 9.2 mg/dL (ref 8.4–10.5)
Chloride: 101 mEq/L (ref 96–112)
Creatinine, Ser: 0.92 mg/dL (ref 0.40–1.50)
GFR: 101.98 mL/min (ref >60.00)
Glucose, Bld: 94 mg/dL (ref 70–99)
Potassium: 4.1 mEq/L (ref 3.5–5.1)
Sodium: 136 mEq/L (ref 135–145)
Total Bilirubin: 1 mg/dL (ref 0.2–1.2)
Total Protein: 7 g/dL (ref 6.0–8.3)

## 2021-10-05 LAB — CBC WITH DIFFERENTIAL/PLATELET
Basophils Absolute: 0 10*3/uL (ref 0.0–0.1)
Basophils Relative: 0.3 % (ref 0.0–3.0)
Eosinophils Absolute: 0.1 10*3/uL (ref 0.0–0.7)
Eosinophils Relative: 1.1 % (ref 0.0–5.0)
HCT: 50.9 % (ref 39.0–52.0)
Hemoglobin: 17.1 g/dL — ABNORMAL HIGH (ref 13.0–17.0)
Lymphocytes Relative: 28.9 % (ref 12.0–46.0)
Lymphs Abs: 1.3 10*3/uL (ref 0.7–4.0)
MCHC: 33.6 g/dL (ref 30.0–36.0)
MCV: 88.7 fl (ref 78.0–100.0)
Monocytes Absolute: 0.3 10*3/uL (ref 0.1–1.0)
Monocytes Relative: 6.9 % (ref 3.0–12.0)
Neutro Abs: 2.9 10*3/uL (ref 1.4–7.7)
Neutrophils Relative %: 62.8 % (ref 43.0–77.0)
Platelets: 225 10*3/uL (ref 150.0–400.0)
RBC: 5.74 Mil/uL (ref 4.22–5.81)
RDW: 13.5 % (ref 11.5–15.5)
WBC: 4.6 10*3/uL (ref 4.0–10.5)

## 2021-10-05 LAB — VITAMIN D 25 HYDROXY (VIT D DEFICIENCY, FRACTURES): VITD: 115.96 ng/mL (ref 30.00–100.00)

## 2021-10-05 LAB — PSA: PSA: 1.07 ng/mL (ref 0.10–4.00)

## 2021-10-05 NOTE — Telephone Encounter (Signed)
CRITICAL LAB: ?Vit D @ 115.96 ? ?PCP made aware and asked for me to call patient. ?Ronald Tyler states that he does take OTC multivitamin and is going to find one without Vit D ?

## 2021-10-05 NOTE — Patient Instructions (Addendum)
Thanks for doing labs ?If you have mychart- we will send your results within 3 business days of [place] receiving them.  ?If you do not have mychart- we will call you about results within 5 business days of [place] receiving them.  ?*please also note that you will see labs on mychart as soon as they post. I will later go in and write notes on them- will say "notes from Dr. Yong Channel"  ? ?Glad you are doing well and fatigue is better- we will keep an eye on the PSA with testosterone ? ?Recommended follow up: Return in about 1 year (around 10/06/2022) for physical or sooner if needed.Schedule b4 you leave. ?

## 2021-10-05 NOTE — Progress Notes (Signed)
?Phone: [phone] ? ?  ?Subjective:  ?Patient presents today for their annual physical. Chief complaint-noted.  ? ?See problem oriented charting- ?ROS- full  review of systems was completed and negative  per full ROS sheet ? ?The following were reviewed and entered/updated in epic: ?Past Medical History:  ?Diagnosis Date  ? L4-L5 disc bulge   ? Pt states he has rec'd 2 injections 6 months apart, last one was 10/28/15  ? MRSA infection   ? infected hair follice in nose. now cleared  ? ?Patient Active Problem List  ? Diagnosis Date Noted  ? Hyperlipidemia 11/19/2016  ? L4-L5 disc bulge   ? ?Past Surgical History:  ?Procedure Laterality Date  ? HERNIA REPAIR Bilateral   ? left 1986, right 2005  ? ? ?Family History  ?Problem Relation Age of Onset  ? Hypertension Father   ? Diabetes Father   ? Prostate cancer Father   ?     age 43  ? Diabetes Maternal Grandmother   ? Stroke Maternal Grandfather   ? Breast cancer Paternal Grandmother   ? Heart disease Paternal Grandfather 102  ?     smoker  ? ? ?Medications- reviewed and updated ?Current Outpatient Medications  ?Medication Sig Dispense Refill  ? Cream Base (ATREVIS HYDROGEL EX)     ? methocarbamol (ROBAXIN) 750 MG tablet Take 1 tablet by mouth as needed.   0  ? ?No current facility-administered medications for this visit.  ? ? ?Allergies-reviewed and updated ?No Known Allergies ? ?Social History  ? ?Social History Narrative  ? Single. Long term partner. May or may not get married. No kids.   ? Partner's brother also lives with them.   ?   ? 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 [place] [employer]  ?   ? Hobbies: loves working (states it his hobby) also Financial planner  ? ?  ?Objective:  ?BP 100/62   Pulse 85   Temp 98.4 ?F (36.9 ?C)   Ht 6' (1.829 m)   Wt 190 lb (86.2 kg)   SpO2 99%   BMI 25.77 kg/m?  ?Gen: NAD, resting comfortably ?HEENT: Mucous membranes are moist. Oropharynx normal ?Neck: no thyromegaly ?CV: RRR  no murmurs rubs or gallops ?Lungs: CTAB no crackles, wheeze, rhonchi ?Abdomen: soft/nontender/nondistended/normal bowel sounds. No rebound or guarding.  ?Ext: no edema ?Skin: warm, dry ?Neuro: grossly normal, moves all extremities, PERRLA ?   ?Assessment and Plan:  ?46 y.o. male presenting for annual physical.  ?Health Maintenance counseling: ?1. Anticipatory guidance: Patient counseled regarding regular dental exams -q6 months, eye exams - yearly,  avoiding smoking and second hand smoke , limiting alcohol to 2 beverages per day- well under- 1st drink last night since november, no illicit drugs.   ?2. Risk factor reduction:  Advised patient of need for regular exercise and diet rich and fruits and vegetables to reduce risk of heart attack and stroke.  ?Exercise- still doing 3 days a week exercise with trainer.  ?Diet/weight management-down 12 lbs from last visit- eating healthy.  ?Wt Readings from Last 3 Encounters:  ?10/05/21 190 lb (86.2 kg)  ?02/29/20 202 lb 9.6 oz (91.9 kg)  ?04/11/18 201 lb (91.2 kg)  ?3. Immunizations/screenings/ancillary studies- wants to hold off on subsequent covid shots. Had most recently January 2022 ?Immunization History  ?Administered Date(s) Administered  ? Hep A / Hep B 11/16/2016, 12/20/2016, 05/22/2017  ? Influenza,inj,Quad PF,6+ Mos 05/22/2017, 03/28/2018, 02/29/2020  ? Influenza-Unspecified  03/28/2016  ? PFIZER(Purple Top)SARS-COV-2 Vaccination 10/17/2019, 11/07/2019  ? Tdap 11/04/2015  ? 4. Prostate cancer screening- father with prostate cancer plus on testosterone- we will monitor PSA with labs  ?Lab Results  ?Component Value Date  ? PSA 0.69 03/01/2020  ? 5. Colon cancer screening - no family history, start at age 37. Dad had some plyps but not told precancerous ?6. Skin cancer screening/prevention- seeing derm next week- Jari Pigg, MD. advised regular sunscreen use. Denies worrisome, changing, or new skin lesions.  ?7. Testicular cancer screening- advised monthly self exams   ?8. STD screening- patient opts in- labs and urine today ?9. Smoking associated screening- Never smoker ? ?Status of chronic or acute concerns  ? ?#hyperlipidemia- ascvd risk has been below 7.5% ?S: Medication:none  ?Lab Results  ?Component Value Date  ? CHOL 167 03/01/2020  ? HDL 40 03/01/2020  ? [place] 100 (H) 03/01/2020  ? TRIG 172 (H) 03/01/2020  ? CHOLHDL 4.2 03/01/2020  ?A/P: hopefully improved with weight loss- states also had a very poor meal night before last labs. The 10-year ASCVD risk score (Arnett DK, et al., 2019) is: 1% ? ?# Low back pain/bulging disc ?S: has seen Dr. Nelva Bush in past- injections about once a year now.  Robaxin through Dr. Nelva Bush ?A/P: overall stable- continue to monitor  ? ?#family history of diabetes in father- screen cbg yearly  ? ?#took prednisone for cough in January-  ?VERY responsive to prednisone- up to 180s blood pressure in the past- goes back down after a month. down to 150s after a week ? 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 Workouts are going better, able to drop some weight ?A/P: fatigue much better- will trend PSA  ? ?Recommended follow up: Return in about 1 year (around 10/06/2022) for physical or sooner if needed.Schedule b4 you leave. ? ?Lab/Order associations: fasting ?  ICD-10-CM   ?1. Preventative health care  Z00.00 CBC with Differential/Platelet  ?  Comprehensive metabolic panel  ?  Lipid panel  ?  PSA  ?  VITAMIN D 25 Hydroxy (Vit-D Deficiency, Fractures)  ?  ?2. Hyperlipidemia, unspecified hyperlipidemia type  E78.5 CBC with Differential/Platelet  ?  Comprehensive metabolic panel  ?  Lipid panel  ?  ?3. Screening for prostate cancer  Z12.5 PSA  ?  ?4. Vitamin D deficiency  E55.9 VITAMIN D 25 Hydroxy (Vit-D Deficiency, Fractures)  ?  ?5. Screening for HIV (human immunodeficiency virus)  Z11.4 HIV Antibody (routine testing w rflx)  ?  ?6. Screening  examination for venereal disease  Z11.3 RPR  ?  ?7. Screening for gonorrhea  Z11.3 Urine cytology ancillary only  ?  ?8. Screening for chlamydial disease  Z11.8 Urine cytology ancillary only  ?  ? ?No orders of the defined types were placed in this encounter. ? ?Return precautions advised.  ?Garret Reddish, MD ? ? ?

## 2021-10-06 ENCOUNTER — Other Ambulatory Visit: Payer: Self-pay

## 2021-10-06 ENCOUNTER — Telehealth: Payer: Self-pay | Admitting: Family Medicine

## 2021-10-06 DIAGNOSIS — R972 Elevated prostate specific antigen [PSA]: Secondary | ICD-10-CM

## 2021-10-06 LAB — HIV ANTIBODY (ROUTINE TESTING W REFLEX): HIV 1&2 Ab, 4th Generation: NONREACTIVE

## 2021-10-06 LAB — RPR: RPR Ser Ql: NONREACTIVE

## 2021-10-06 NOTE — Telephone Encounter (Signed)
Lab states the orders for the urine collection have a status as "future" and request that it be updated. ?

## 2021-10-06 NOTE — Telephone Encounter (Signed)
Order has been updated and released.  ?

## 2021-10-09 ENCOUNTER — Ambulatory Visit: Payer: 59 | Admitting: Family

## 2021-10-09 LAB — URINE CYTOLOGY ANCILLARY ONLY
Chlamydia: NEGATIVE
Comment: NEGATIVE
Comment: NEGATIVE
Comment: NORMAL
Neisseria Gonorrhea: NEGATIVE
Trichomonas: NEGATIVE

## 2021-12-11 ENCOUNTER — Other Ambulatory Visit (INDEPENDENT_AMBULATORY_CARE_PROVIDER_SITE_OTHER): Payer: 59

## 2021-12-11 ENCOUNTER — Encounter: Payer: Self-pay | Admitting: Family Medicine

## 2021-12-11 DIAGNOSIS — R972 Elevated prostate specific antigen [PSA]: Secondary | ICD-10-CM

## 2021-12-11 LAB — PSA: PSA: 1.08 ng/mL (ref 0.10–4.00)

## 2021-12-11 MED ORDER — ESZOPICLONE 1 MG PO TABS: 1.0000 mg | ORAL_TABLET | Freq: Every evening | ORAL | 0 refills | Status: DC | PRN

## 2021-12-12 MED ORDER — ESZOPICLONE 1 MG PO TABS
1.0000 mg | ORAL_TABLET | Freq: Every evening | ORAL | 0 refills | Status: DC | PRN
Start: 2021-12-12 — End: 2022-02-07

## 2021-12-12 NOTE — Addendum Note (Signed)
Addended by: Marin Olp on: 12/12/2021 01:02 PM   Modules accepted: Orders

## 2022-01-27 ENCOUNTER — Telehealth: Payer: 59 | Admitting: Nurse Practitioner

## 2022-01-27 DIAGNOSIS — H60333 Swimmer's ear, bilateral: Secondary | ICD-10-CM | POA: Diagnosis not present

## 2022-01-27 MED ORDER — OFLOXACIN 0.3 % OT SOLN: 5.0000 [drp] | Freq: Every day | OTIC | 0 refills | Status: AC

## 2022-01-27 NOTE — Progress Notes (Signed)
Virtual Visit Consent   Ronald Tyler, you are scheduled for a virtual visit with a [facility] provider today. Just as with appointments in the office, your consent must be obtained to participate. Your consent will be active for this visit and any virtual visit you may have with one of our providers in the next 365 days. If you have a MyChart account, a copy of this consent can be sent to you electronically.  As this is a virtual visit, video technology does not allow for your provider to perform a traditional examination. This may limit your provider's ability to fully assess your condition. If your provider identifies any concerns that need to be evaluated in person or the need to arrange testing (such as labs, EKG, etc.), we will make arrangements to do so. Although advances in technology are sophisticated, we cannot ensure that it will always work on either your end or our end. If the connection with a video visit is poor, the visit may have to be switched to a telephone visit. With either a video or telephone visit, we are not always able to ensure that we have a secure connection.  By engaging in this virtual visit, you consent to the provision of healthcare and authorize for your insurance to be billed (if applicable) for the services provided during this visit. Depending on your insurance coverage, you may receive a charge related to this service.  I need to obtain your verbal consent now. Are you willing to proceed with your visit today? [name] has provided verbal consent on 01/27/2022 for a virtual visit (video or telephone). [name], NP  Date: 01/27/2022 1:38 PM  Virtual Visit via Video Note   I, [name], connected with  Ronald Tyler  ([id number], Jul 14, 1975) on 01/27/22 at  2:00 PM EDT by a video-enabled telemedicine application and verified that I am speaking with the correct person using two identifiers.  Location: Patient: Virtual Visit  Location Patient: Home Provider: Virtual Visit Location Provider: Home Office   I discussed the limitations of evaluation and management by telemedicine and the availability of in person appointments. The patient expressed understanding and agreed to proceed.    History of Present Illness: Ronald Tyler is a 46 y.o. who identifies as a male who was assigned male at birth, and is being seen today for otitis externa.  Ear Pain: Patient complains of bilateral ear drainage , bilateral ear pain, congestion, and plugged sensation in both ears.  Onset of symptoms was a few weeks ago, unchanged since that time. He also notes drainage in both ears.  He does not have a history of ear infections.  He does have a history of recent swimming.    Problems:  Patient Active Problem List   Diagnosis Date Noted   Hyperlipidemia 11/19/2016   L4-L5 disc bulge     Allergies: No Known Allergies Medications:  Current Outpatient Medications:    ofloxacin (FLOXIN OTIC) 0.3 % OTIC solution, Place 5 drops into both ears daily for 7 days., Disp: 5 mL, Rfl: 0   Cream Base (ATREVIS HYDROGEL EX), , Disp: , Rfl:    eszopiclone (LUNESTA) 1 MG TABS tablet, Take 1 tablet (1 mg total) by mouth at bedtime as needed for sleep (Do not drive for 8 hours after taking.  Rx for international travel). Take immediately before bedtime, Disp: 5 tablet, Rfl: 0   methocarbamol (ROBAXIN) 750 MG tablet, Take 1 tablet by mouth as needed. ,  Disp: , Rfl: 0  Observations/Objective: Patient is well-developed, well-nourished in no acute distress.  Resting comfortably  at home.  Head is normocephalic, atraumatic.  No labored breathing.  Speech is clear and coherent with logical content.  Patient is alert and oriented at baseline.    Assessment and Plan: 1. Acute swimmer's ear of both sides - ofloxacin (FLOXIN OTIC) 0.3 % OTIC solution; Place 5 drops into both ears daily for 7 days.  Dispense: 5 mL; Refill: 0    Follow Up  Instructions: I discussed the assessment and treatment plan with the patient. The patient was provided an opportunity to ask questions and all were answered. The patient agreed with the plan and demonstrated an understanding of the instructions.  A copy of instructions were sent to the patient via MyChart unless otherwise noted below.    The patient was advised to call back or seek an in-person evaluation if the symptoms worsen or if the condition fails to improve as anticipated.  Time:  I spent 11 minutes with the patient via telehealth technology discussing the above problems/concerns.    [name], NP

## 2022-01-27 NOTE — Patient Instructions (Signed)
  Ronald Tyler, thank you for joining [name], NP for today's virtual visit.  While this provider is not your primary care provider (PCP), if your PCP is located in our provider database this encounter information will be shared with them immediately following your visit.  Consent: (Patient) Ronald Tyler provided verbal consent for this virtual visit at the beginning of the encounter.  Current Medications:  Current Outpatient Medications:    ofloxacin (FLOXIN OTIC) 0.3 % OTIC solution, Place 5 drops into both ears daily for 7 days., Disp: 5 mL, Rfl: 0   Cream Base (ATREVIS HYDROGEL EX), , Disp: , Rfl:    eszopiclone (LUNESTA) 1 MG TABS tablet, Take 1 tablet (1 mg total) by mouth at bedtime as needed for sleep (Do not drive for 8 hours after taking.  Rx for international travel). Take immediately before bedtime, Disp: 5 tablet, Rfl: 0   methocarbamol (ROBAXIN) 750 MG tablet, Take 1 tablet by mouth as needed. , Disp: , Rfl: 0   Medications ordered in this encounter:  Meds ordered this encounter  Medications   ofloxacin (FLOXIN OTIC) 0.3 % OTIC solution    Sig: Place 5 drops into both ears daily for 7 days.    Dispense:  5 mL    Refill:  0    Order Specific Question:   Supervising Provider    Answer:   Sabra Heck, BRIAN [[id number]]     *If you need refills on other medications prior to your next appointment, please contact your pharmacy*  Follow-Up: Call back or seek an in-person evaluation if the symptoms worsen or if the condition fails to improve as anticipated.  Other Instructions May apply warm compresses to ear for relief as well as taking motrin and tylenol for pain   If you have been instructed to have an in-person evaluation today at a local Urgent Care facility, please use the link below. It will take you to a list of all of our available [facility], including address, phone number and hours of operation. Please do not delay care.  [facility]  If you or a family member do not have a primary care provider, use the link below to schedule a visit and establish care. When you choose a [facility] primary care physician or advanced practice provider, you gain a long-term partner in health. Find a Primary Care Provider  Learn more about [facility]'s in-office and virtual care options: [facility]

## 2022-02-07 ENCOUNTER — Encounter: Payer: Self-pay | Admitting: Family Medicine

## 2022-02-07 ENCOUNTER — Ambulatory Visit (INDEPENDENT_AMBULATORY_CARE_PROVIDER_SITE_OTHER): Payer: 59 | Admitting: Family Medicine

## 2022-02-07 VITALS — BP 120/82 | HR 73 | Temp 97.9°F | Ht 72.0 in | Wt 199.2 lb

## 2022-02-07 DIAGNOSIS — H6501 Acute serous otitis media, right ear: Secondary | ICD-10-CM

## 2022-02-07 MED ORDER — AMOXICILLIN-POT CLAVULANATE 875-125 MG PO TABS: 1.0000 | ORAL_TABLET | Freq: Two times a day (BID) | ORAL | 0 refills | Status: DC

## 2022-02-07 NOTE — Progress Notes (Signed)
Subjective:     Patient ID: Ronald Tyler, male    DOB: 22-Jul-1975, 46 y.o.   MRN: [id number]  Chief Complaint  Patient presents with   Ear Pain    Bilateral ear pain, fluid in ear, right one is worse, pain started about 5 weeks ago     HPI B ear pain R>L for 5 wks-was on floxin otic from video visit on 8/26.  Went to [place] end of July and swam in [place].  Getting worse past 1 wk and clogged R ear now.  Floxin for 7 days, no change. Physician friend saw fluid behind ear drum.  No f/c. No d/c from ears.  On-going tickle cough.  Not feel badly.  Some post nasa drip.   There are no preventive care reminders to display for this patient.   Past Medical History:  Diagnosis Date   L4-L5 disc bulge    Pt states he has rec'd 2 injections 6 months apart, last one was 10/28/15   MRSA infection    infected hair follice in nose. now cleared    Past Surgical History:  Procedure Laterality Date   HERNIA REPAIR Bilateral    left 1986, right 2005    Outpatient Medications Prior to Visit  Medication Sig Dispense Refill   Cream Base (ATREVIS HYDROGEL EX)      Emollient (CERAVE AM SPF 30 EX) Apply topically.     methocarbamol (ROBAXIN) 750 MG tablet Take 1 tablet by mouth as needed.   0   eszopiclone (LUNESTA) 1 MG TABS tablet Take 1 tablet (1 mg total) by mouth at bedtime as needed for sleep (Do not drive for 8 hours after taking.  Rx for international travel). Take immediately before bedtime 5 tablet 0   No facility-administered medications prior to visit.    No Known Allergies ROS neg/noncontributory except as noted HPI/below      Objective:     BP 120/82   Pulse 73   Temp 97.9 F (36.6 C) (Temporal)   Ht 6' (1.829 m)   Wt 199 lb 4 oz (90.4 kg)   SpO2 96%   BMI 27.02 kg/m  Wt Readings from Last 3 Encounters:  02/07/22 199 lb 4 oz (90.4 kg)  10/05/21 190 lb (86.2 kg)  02/29/20 202 lb 9.6 oz (91.9 kg)    Physical Exam   Gen: WDWN NAD HEENT: NCAT,  conjunctiva not injected, sclera nonicteric TM WNL L, R-TM normal except some yellow fluid behind.  OP moist, no exudates  NECK:  supple, no thyromegaly, no nodes, no carotid bruits CARDIAC: RRR, S1S2+, no murmur. LUNGS: CTAB. No wheezes EXT:  no edema MSK: no gross abnormalities.  NEURO: A&O x3.  CN II-XII intact.  PSYCH: normal mood. Good eye contact     Assessment & Plan:   Problem List Items Addressed This Visit   None Visit Diagnoses     Non-recurrent acute serous otitis media of right ear    -  Primary   Relevant Medications   amoxicillin-clavulanate (AUGMENTIN) 875-125 MG tablet      R otitis media-augmentin 875 bid x 10d.  Can do sudafed and gum for flying.  May take up to 12 wks to resolve.    Meds ordered this encounter  Medications   amoxicillin-clavulanate (AUGMENTIN) 875-125 MG tablet    Sig: Take 1 tablet by mouth 2 (two) times daily.    Dispense:  20 tablet    Refill:  0    Celene Squibb  Cherlynn Kaiser, MD

## 2022-02-07 NOTE — Patient Instructions (Signed)
It was very nice to see you today!  Can take sudafed for flying and chew gum Pick up augmentin from pharmacy.     PLEASE NOTE:  If you had any lab tests please let us know if you have not heard back within a few days. You may see your results on MyChart before we have a chance to review them but we will give you a call once they are reviewed by [place]. If we ordered any referrals today, please let us know if you have not heard from their office within the next week.   Please try these tips to maintain a healthy lifestyle:  Eat most of your calories during the day when you are active. Eliminate processed foods including packaged sweets (pies, cakes, cookies), reduce intake of potatoes, white bread, white pasta, and white rice. Look for whole grain options, oat flour or almond flour.  Each meal should contain half fruits/vegetables, one quarter protein, and one quarter carbs (no bigger than a computer mouse).  Cut down on sweet beverages. This includes juice, soda, and sweet tea. Also watch fruit intake, though this is a healthier sweet option, it still contains natural sugar! Limit to 3 servings daily.  Drink at least 1 glass of water with each meal and aim for at least 8 glasses per day  Exercise at least 150 minutes every week.

## 2022-07-05 ENCOUNTER — Other Ambulatory Visit: Payer: Self-pay | Admitting: Urology

## 2022-07-06 NOTE — Progress Notes (Signed)
Talked with patient. Instructions given. Hx and Meds reviewed. Arrival time 35. NPO after MN Mom is the driver.

## 2022-07-06 NOTE — Progress Notes (Signed)
Left voice mail to return call for instructions for litho

## 2022-07-09 ENCOUNTER — Encounter (HOSPITAL_BASED_OUTPATIENT_CLINIC_OR_DEPARTMENT_OTHER)
Admission: RE | Disposition: A | Discharge status: Home / Self Care | Payer: Self-pay | Source: Ambulatory Visit | Attending: Urology

## 2022-07-09 ENCOUNTER — Ambulatory Visit (HOSPITAL_BASED_OUTPATIENT_CLINIC_OR_DEPARTMENT_OTHER)
Admission: RE | Admit: 2022-07-09 | Discharge: 2022-07-09 | Disposition: A | Discharge status: Home / Self Care | Payer: 59 | Source: Ambulatory Visit | Attending: Urology | Admitting: Urology

## 2022-07-09 ENCOUNTER — Ambulatory Visit (HOSPITAL_COMMUNITY): Payer: 59

## 2022-07-09 ENCOUNTER — Encounter (HOSPITAL_BASED_OUTPATIENT_CLINIC_OR_DEPARTMENT_OTHER): Payer: Self-pay | Admitting: Urology

## 2022-07-09 DIAGNOSIS — Z8719 Personal history of other diseases of the digestive system: Secondary | ICD-10-CM | POA: Insufficient documentation

## 2022-07-09 DIAGNOSIS — N201 Calculus of ureter: Secondary | ICD-10-CM | POA: Insufficient documentation

## 2022-07-09 HISTORY — PX: EXTRACORPOREAL SHOCK WAVE LITHOTRIPSY: SHX1557

## 2022-07-09 SURGERY — LITHOTRIPSY, ESWL
Anesthesia: LOCAL | Laterality: Left

## 2022-07-09 MED ORDER — CIPROFLOXACIN HCL 500 MG PO TABS
ORAL_TABLET | ORAL | Status: AC
  Filled 2022-07-09: qty 1

## 2022-07-09 MED ORDER — OXYCODONE-ACETAMINOPHEN 5-325 MG PO TABS: 1.0000 | ORAL_TABLET | Freq: Four times a day (QID) | ORAL | 0 refills | Status: AC | PRN

## 2022-07-09 MED ORDER — DIPHENHYDRAMINE HCL 25 MG PO CAPS
ORAL_CAPSULE | ORAL | Status: AC
  Filled 2022-07-09: qty 1

## 2022-07-09 MED ORDER — DIAZEPAM 5 MG PO TABS
10.0000 mg | ORAL_TABLET | ORAL | Status: AC
  Administered 2022-07-09: 10 mg via ORAL

## 2022-07-09 MED ORDER — DIPHENHYDRAMINE HCL 25 MG PO CAPS
25.0000 mg | ORAL_CAPSULE | ORAL | Status: AC
  Administered 2022-07-09: 25 mg via ORAL

## 2022-07-09 MED ORDER — DIAZEPAM 5 MG PO TABS
ORAL_TABLET | ORAL | Status: AC
  Filled 2022-07-09: qty 2

## 2022-07-09 MED ORDER — CIPROFLOXACIN HCL 500 MG PO TABS
500.0000 mg | ORAL_TABLET | ORAL | Status: AC
  Administered 2022-07-09: 500 mg via ORAL

## 2022-07-09 MED ORDER — SODIUM CHLORIDE 0.9 % IV SOLN: INTRAVENOUS | Status: DC

## 2022-07-09 NOTE — Discharge Instructions (Signed)
1. You should strain your urine and collect all fragments and bring them to your follow up appointment.  °2. You should take your pain medication as needed.  Please call if your pain is severe to the point that it is not controlled with your pain medication. °3. You should call if you develop fever > 101 or persistent nausea or vomiting. °4. Your doctor may prescribe tamsulosin to take to help facilitate stone passage. °

## 2022-07-09 NOTE — Op Note (Signed)
See [employer] operative note scanned into chart. Also because of the size, density, location and other factors that cannot be anticipated I feel this will likely be a staged procedure. This fact supersedes any indication in the scanned [place] stone operative note to the contrary.  Donald Pore MD 07/09/2022, 12:50 PM  [facility]  Pager: [phone]

## 2022-07-09 NOTE — H&P (Signed)
H&P  History of Present Illness: Tavis Kring is a 47 y.o. year old M who presents today for L ESWL to treat a distal left ureteral stone.  Past Medical History:  Diagnosis Date   L4-L5 disc bulge    Pt states he has rec'd 2 injections 6 months apart, last one was 10/28/15   MRSA infection    infected hair follice in nose. now cleared    Past Surgical History:  Procedure Laterality Date   HERNIA REPAIR Bilateral    left 1986, right 2005    Home Medications:  Current Meds  Medication Sig   HYDROcodone-acetaminophen (NORCO) 7.5-325 MG tablet Take 1 tablet by mouth every 6 (six) hours as needed for moderate pain.   ondansetron (ZOFRAN-ODT) 4 MG disintegrating tablet Take 8 mg by mouth every 8 (eight) hours as needed for nausea or vomiting.   tamsulosin (FLOMAX) 0.4 MG CAPS capsule Take 0.4 mg by mouth daily after supper.    Allergies: No Known Allergies  Family History  Problem Relation Age of Onset   Hypertension Father    Diabetes Father    Prostate cancer Father        age 60   Diabetes Maternal Grandmother    Stroke Maternal Grandfather    Breast cancer Paternal Grandmother    Heart disease Paternal Grandfather 60       smoker    Social History:  reports that he has never smoked. He has never used smokeless tobacco. He reports current alcohol use. He reports that he does not use drugs.  ROS: A complete review of systems was performed.  All systems are negative except for pertinent findings as noted.  Physical Exam:  Vital signs in last 24 hours: BP: ()/()  Arterial Line BP: ()/()  Constitutional:  Alert and oriented, No acute distress Cardiovascular: Regular rate and rhythm Respiratory: Normal respiratory effort, Lungs clear bilaterally GI: Abdomen is soft, nontender, nondistended, no abdominal masses Lymphatic: No lymphadenopathy Neurologic: Grossly intact, no focal deficits Psychiatric: Normal mood and affect   Laboratory Data:  No results for  input(s): "WBC", "HGB", "HCT", "PLT" in the last 72 hours.  No results for input(s): "NA", "K", "CL", "GLUCOSE", "BUN", "CALCIUM", "CREATININE" in the last 72 hours.  Invalid input(s): "CO3"   No results found for this or any previous visit (from the past 24 hour(s)). No results found for this or any previous visit (from the past 240 hour(s)).  Renal Function: No results for input(s): "CREATININE" in the last 168 hours. CrCl cannot be calculated (Patient's most recent lab result is older than the maximum 21 days allowed.).  Radiologic Imaging: No results found.  Assessment:  Joeph Szatkowski is a 48 y.o. year old M with left distal ureteral stone  Plan:  To OR as planned for L ESWL  Donald Pore, MD 07/09/2022, 9:24 AM  [facility] Pager: [phone]

## 2022-07-10 ENCOUNTER — Encounter (HOSPITAL_BASED_OUTPATIENT_CLINIC_OR_DEPARTMENT_OTHER): Payer: Self-pay | Admitting: Urology

## 2022-10-08 ENCOUNTER — Encounter: Payer: Self-pay | Admitting: Family Medicine

## 2022-10-08 ENCOUNTER — Other Ambulatory Visit (HOSPITAL_COMMUNITY)
Admission: RE | Admit: 2022-10-08 | Discharge: 2022-10-08 | Disposition: A | Discharge status: Home / Self Care | Payer: 59 | Source: Ambulatory Visit | Attending: Family Medicine | Admitting: Family Medicine

## 2022-10-08 ENCOUNTER — Ambulatory Visit (INDEPENDENT_AMBULATORY_CARE_PROVIDER_SITE_OTHER): Payer: 59 | Admitting: Family Medicine

## 2022-10-08 VITALS — BP 131/88 | HR 79 | Temp 98.2°F | Resp 16 | Ht 72.0 in | Wt 206.8 lb

## 2022-10-08 DIAGNOSIS — E785 Hyperlipidemia, unspecified: Secondary | ICD-10-CM | POA: Diagnosis not present

## 2022-10-08 DIAGNOSIS — E349 Endocrine disorder, unspecified: Secondary | ICD-10-CM

## 2022-10-08 DIAGNOSIS — Z833 Family history of diabetes mellitus: Secondary | ICD-10-CM

## 2022-10-08 DIAGNOSIS — Z131 Encounter for screening for diabetes mellitus: Secondary | ICD-10-CM

## 2022-10-08 DIAGNOSIS — Z0000 Encounter for general adult medical examination without abnormal findings: Secondary | ICD-10-CM | POA: Insufficient documentation

## 2022-10-08 DIAGNOSIS — E673 Hypervitaminosis D: Secondary | ICD-10-CM | POA: Diagnosis not present

## 2022-10-08 DIAGNOSIS — Z118 Encounter for screening for other infectious and parasitic diseases: Secondary | ICD-10-CM

## 2022-10-08 DIAGNOSIS — Z113 Encounter for screening for infections with a predominantly sexual mode of transmission: Secondary | ICD-10-CM

## 2022-10-08 DIAGNOSIS — Z114 Encounter for screening for human immunodeficiency virus [HIV]: Secondary | ICD-10-CM

## 2022-10-08 DIAGNOSIS — Z125 Encounter for screening for malignant neoplasm of prostate: Secondary | ICD-10-CM

## 2022-10-08 LAB — CBC WITH DIFFERENTIAL/PLATELET
Basophils Absolute: 0 10*3/uL (ref 0.0–0.1)
Basophils Relative: 0.5 % (ref 0.0–3.0)
Eosinophils Absolute: 0.1 10*3/uL (ref 0.0–0.7)
Eosinophils Relative: 1.8 % (ref 0.0–5.0)
HCT: 49 % (ref 39.0–52.0)
Hemoglobin: 16.8 g/dL (ref 13.0–17.0)
Lymphocytes Relative: 31.4 % (ref 12.0–46.0)
Lymphs Abs: 1.8 10*3/uL (ref 0.7–4.0)
MCHC: 34.4 g/dL (ref 30.0–36.0)
MCV: 88.3 fl (ref 78.0–100.0)
Monocytes Absolute: 0.5 10*3/uL (ref 0.1–1.0)
Monocytes Relative: 8 % (ref 3.0–12.0)
Neutro Abs: 3.3 10*3/uL (ref 1.4–7.7)
Neutrophils Relative %: 58.3 % (ref 43.0–77.0)
Platelets: 237 10*3/uL (ref 150.0–400.0)
RBC: 5.55 Mil/uL (ref 4.22–5.81)
RDW: 13.3 % (ref 11.5–15.5)
WBC: 5.7 10*3/uL (ref 4.0–10.5)

## 2022-10-08 LAB — COMPREHENSIVE METABOLIC PANEL
ALT: 53 U/L (ref 0–53)
AST: 25 U/L (ref 0–37)
Albumin: 4.2 g/dL (ref 3.5–5.2)
Alkaline Phosphatase: 58 U/L (ref 39–117)
BUN: 9 mg/dL (ref 6–23)
CO2: 28 mEq/L (ref 19–32)
Calcium: 9.1 mg/dL (ref 8.4–10.5)
Chloride: 101 mEq/L (ref 96–112)
Creatinine, Ser: 1.02 mg/dL (ref 0.40–1.50)
GFR: 89.47 mL/min (ref >60.00)
Glucose, Bld: 96 mg/dL (ref 70–99)
Potassium: 4.1 mEq/L (ref 3.5–5.1)
Sodium: 138 mEq/L (ref 135–145)
Total Bilirubin: 1 mg/dL (ref 0.2–1.2)
Total Protein: 6.7 g/dL (ref 6.0–8.3)

## 2022-10-08 LAB — LIPID PANEL
Cholesterol: 219 mg/dL — ABNORMAL HIGH (ref 0–200)
HDL: 44.2 mg/dL (ref >39.00)
NonHDL: 174.94
Total CHOL/HDL Ratio: 5
Triglycerides: 233 mg/dL — ABNORMAL HIGH (ref 0.0–149.0)
VLDL: 46.6 mg/dL — ABNORMAL HIGH (ref 0.0–40.0)

## 2022-10-08 LAB — TESTOSTERONE: Testosterone: 293.48 ng/dL — ABNORMAL LOW (ref 300.00–890.00)

## 2022-10-08 LAB — LDL CHOLESTEROL, DIRECT: Direct LDL: 145 mg/dL

## 2022-10-08 LAB — PSA: PSA: 0.96 ng/mL (ref 0.10–4.00)

## 2022-10-08 LAB — HEMOGLOBIN A1C: Hgb A1c MFr Bld: 4.9 % (ref 4.6–6.5)

## 2022-10-08 LAB — VITAMIN D 25 HYDROXY (VIT D DEFICIENCY, FRACTURES): VITD: 47.25 ng/mL (ref 30.00–100.00)

## 2022-10-08 NOTE — Progress Notes (Addendum)
Phone: [phone]    Subjective:  Patient presents today for their annual physical. Chief complaint-noted.   See problem oriented charting- ROS- full  review of systems was completed and negative  Per full ROS sheet completed by patient   The following were reviewed and entered/updated in epic: Past Medical History:  Diagnosis Date   L4-L5 disc bulge    Pt states he has rec'd 2 injections 6 months apart, last one was 10/28/15   MRSA infection    infected hair follice in nose. now cleared   Patient Active Problem List   Diagnosis Date Noted   Hyperlipidemia 11/19/2016   L4-L5 disc bulge    Past Surgical History:  Procedure Laterality Date   EXTRACORPOREAL SHOCK WAVE LITHOTRIPSY Left 07/09/2022   Procedure: LEFT EXTRACORPOREAL SHOCK WAVE LITHOTRIPSY (ESWL);  Surgeon: Despina Arias, MD;  Location: [facility];  Service: Urology;  Laterality: Left;   HERNIA REPAIR Bilateral    left 1986, right 2005    Family History  Problem Relation Age of Onset   Hypertension Father    Diabetes Father    Prostate cancer Father        age 36   Diabetes Maternal Grandmother    Stroke Maternal Grandfather    Breast cancer Paternal Grandmother    Heart disease Paternal Grandfather 8       smoker    Medications- reviewed and updated Current Outpatient Medications  Medication Sig Dispense Refill   Cream Base (ATREVIS HYDROGEL EX)      Emollient (CERAVE AM SPF 30 EX) Apply topically.     methocarbamol (ROBAXIN) 750 MG tablet Take 1 tablet by mouth as needed.   0   No current facility-administered medications for this visit.    Allergies-reviewed and updated No Known Allergies  Social History   Social History Narrative   Single. Long term partner. May or may not get married. No kids.    Partner's brother also lives with them.       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 Oropharynx normal Neck: no thyromegaly CV: RRR no murmurs rubs or gallops Lungs: CTAB no crackles, wheeze, rhonchi Abdomen: soft/nontender/nondistended/normal bowel sounds. No rebound or guarding.  Ext: no edema Skin: warm, dry Neuro: grossly normal, moves all extremities, PERRLA    Assessment and Plan:  47 y.o. male presenting for annual physical.  Health Maintenance counseling: 1. Anticipatory guidance: Patient counseled regarding regular dental exams -q6 months, eye exams - yearly,  avoiding smoking and second hand smoke , limiting alcohol to 2 beverages per day- very rare social, no illicit drugs.   2. Risk factor reduction:  Advised patient of need for regular exercise and diet rich and fruits and vegetables to reduce risk of heart attack and stroke.  Exercise- last year working 3 days a week with trainer- trainer transferred out and habit has changed some- more intermittent- when things settle wants to get back to at least 3x a week .  Diet/weight management-weight  up 16 pounds from last year- feels could improve healthy eating habits- consider meal prepping but hard with schedule.  Wt Readings from Last 3 Encounters:  10/08/22 206 lb 12.8 oz (93.8 kg)  07/09/22 201 lb 14.4 oz (91.6 kg)  02/07/22 199 lb 4 oz (90.4 kg)  3. Immunizations/screenings/ancillary studies-up-to-date as declines further COVID-19 vaccinations  Immunization History  Administered Date(s) Administered   Hep A / Hep B 11/16/2016, 12/20/2016, 05/22/2017   Influenza,inj,Quad PF,6+ Mos 05/22/2017, 03/28/2018, 02/29/2020   Influenza-Unspecified 03/28/2016   PFIZER(Purple Top)SARS-COV-2 Vaccination 10/17/2019, 11/07/2019   Tdap  11/04/2015  4. Prostate cancer screening- father with prostate cancer history so we will continue to monitor PSA. He is also on testosterone atrevis which is cheaper. Also had rectal exam with urology which was low risk per report Lab Results  Component Value Date   PSA 1.08 12/11/2021   PSA 1.07 10/05/2021   PSA 0.69 03/01/2020  5. Colon cancer screening - no family history, start at age 36. Dad had some plyps but not told precancerous  6. Skin cancer screening/prevention-follows with Elmon Else, MD. advised regular sunscreen use. Denies worrisome, changing, or new skin lesions.  7. Testicular cancer screening- advised monthly self exams   8. STD screening- patient opts in- labs and urine today  9. Smoking associated screening- Never smoker   Status of chronic or acute concerns   #hyperlipidemia- ascvd risk has been below 7.5% S: Medication:none. Mostly chicken/fish Lab Results  Component Value Date   CHOL 178 10/05/2021   HDL 45.60 10/05/2021   LDLCALC 112 (H) 10/05/2021   TRIG 104.0 10/05/2021   CHOLHDL 4 10/05/2021  A/P: The 10-year ASCVD risk score (Arnett DK, et al., 2019) is: 1.7%. With low arteriosclerotic cardiovascular disease risk- continue to work on healthy eating/regular exercise and update lipids- unlikely to start statin  # Low back pain/bulging disc S: has seen Dr. Ethelene Hal in past about every 8 months - currently about a year ago- doing better . As needed robaxin.  A/P: better overall continue to monitor    #family history of diabetes in father- screen cbg yearly and a1c  # History of kidney stones-patient with lithotripsy July 09, 2022   #home Blood pressure- 120s/80's . High acceptable here but likely white coat element  #Vitamin D  excess- high in past on 10000 units per day- down to 2000 and we are hoping for improve dlevels. When he was off completely felt a lot of fatigue  Recommended follow up: Return in about 1 year (around 10/08/2023) for physical or  sooner if needed.Schedule b4 you leave.  Lab/Order associations: fasting   ICD-10-CM   1. Preventative health care  Z00.00 CBC with Differential/Platelet    Comprehensive metabolic panel    Lipid panel    Hemoglobin A1c    PSA    HIV Antibody (routine testing w rflx)    RPR    Urine cytology ancillary only    2. Screening for diabetes mellitus  Z13.1 Hemoglobin A1c    3. Family history of diabetes mellitus  Z83.3 Hemoglobin A1c    4. Hyperlipidemia, unspecified hyperlipidemia type  E78.5 CBC with Differential/Platelet    Comprehensive metabolic panel    Lipid panel    5. Screening for prostate cancer  Z12.5 PSA    6. Screening for HIV (human immunodeficiency virus)  Z11.4 HIV Antibody (routine testing w rflx)    7. Screening examination for venereal disease  Z11.3 RPR    8. Screening for gonorrhea  Z11.3 Urine cytology ancillary only    9. Screening for chlamydial disease  Z11.8 Urine cytology ancillary only    10. 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   Return precautions advised.   Tana Conch, MD

## 2022-10-08 NOTE — Patient Instructions (Addendum)
Please stop by lab before you go If you have mychart- we will send your results within 3 business days of [place] receiving them.  If you do not have mychart- we will call you about results within 5 business days of [place] receiving them.  *please also note that you will see labs on mychart as soon as they post. I will later go in and write notes on them- will say "notes from Dr. Durene Cal"   When things settle down would love for you to restart the exercise. Trying to do some planning for meals may help with your schedule  Recommended follow up: Return in about 1 year (around 10/08/2023) for physical or sooner if needed.Schedule b4 you leave.

## 2022-10-08 NOTE — Addendum Note (Signed)
Addended by: Shelva Majestic on: 10/08/2022 08:25 AM   Modules accepted: Orders

## 2022-10-09 LAB — URINE CYTOLOGY ANCILLARY ONLY
Chlamydia: NEGATIVE
Comment: NEGATIVE
Comment: NEGATIVE
Comment: NORMAL
Neisseria Gonorrhea: NEGATIVE
Trichomonas: NEGATIVE

## 2022-10-09 LAB — RPR: RPR Ser Ql: NONREACTIVE

## 2022-10-09 LAB — HIV ANTIBODY (ROUTINE TESTING W REFLEX): HIV 1&2 Ab, 4th Generation: NONREACTIVE

## 2023-10-09 ENCOUNTER — Encounter: Payer: Self-pay | Admitting: Family Medicine

## 2023-10-09 ENCOUNTER — Ambulatory Visit (INDEPENDENT_AMBULATORY_CARE_PROVIDER_SITE_OTHER): Payer: 59 | Admitting: Family Medicine

## 2023-10-09 ENCOUNTER — Other Ambulatory Visit: Payer: Self-pay

## 2023-10-09 ENCOUNTER — Other Ambulatory Visit (HOSPITAL_COMMUNITY)
Admission: RE | Admit: 2023-10-09 | Discharge: 2023-10-09 | Disposition: A | Discharge status: Home / Self Care | Source: Ambulatory Visit | Attending: Family Medicine | Admitting: Family Medicine

## 2023-10-09 VITALS — BP 102/80 | HR 77 | Ht 72.0 in | Wt 191.2 lb

## 2023-10-09 DIAGNOSIS — Z131 Encounter for screening for diabetes mellitus: Secondary | ICD-10-CM | POA: Diagnosis not present

## 2023-10-09 DIAGNOSIS — Z113 Encounter for screening for infections with a predominantly sexual mode of transmission: Secondary | ICD-10-CM

## 2023-10-09 DIAGNOSIS — Z114 Encounter for screening for human immunodeficiency virus [HIV]: Secondary | ICD-10-CM

## 2023-10-09 DIAGNOSIS — E663 Overweight: Secondary | ICD-10-CM | POA: Diagnosis not present

## 2023-10-09 DIAGNOSIS — Z118 Encounter for screening for other infectious and parasitic diseases: Secondary | ICD-10-CM

## 2023-10-09 DIAGNOSIS — Z0000 Encounter for general adult medical examination without abnormal findings: Secondary | ICD-10-CM | POA: Diagnosis not present

## 2023-10-09 DIAGNOSIS — E785 Hyperlipidemia, unspecified: Secondary | ICD-10-CM | POA: Diagnosis not present

## 2023-10-09 DIAGNOSIS — Z1211 Encounter for screening for malignant neoplasm of colon: Secondary | ICD-10-CM

## 2023-10-09 DIAGNOSIS — Z125 Encounter for screening for malignant neoplasm of prostate: Secondary | ICD-10-CM | POA: Diagnosis not present

## 2023-10-09 LAB — CBC WITH DIFFERENTIAL/PLATELET
Basophils Absolute: 0 10*3/uL (ref 0.0–0.1)
Basophils Relative: 0.5 % (ref 0.0–3.0)
Eosinophils Absolute: 0.1 10*3/uL (ref 0.0–0.7)
Eosinophils Relative: 1.3 % (ref 0.0–5.0)
HCT: 50.2 % (ref 39.0–52.0)
Hemoglobin: 16.9 g/dL (ref 13.0–17.0)
Lymphocytes Relative: 31.6 % (ref 12.0–46.0)
Lymphs Abs: 1.7 10*3/uL (ref 0.7–4.0)
MCHC: 33.7 g/dL (ref 30.0–36.0)
MCV: 87.6 fl (ref 78.0–100.0)
Monocytes Absolute: 0.4 10*3/uL (ref 0.1–1.0)
Monocytes Relative: 8.1 % (ref 3.0–12.0)
Neutro Abs: 3.1 10*3/uL (ref 1.4–7.7)
Neutrophils Relative %: 58.5 % (ref 43.0–77.0)
Platelets: 237 10*3/uL (ref 150.0–400.0)
RBC: 5.73 Mil/uL (ref 4.22–5.81)
RDW: 13.7 % (ref 11.5–15.5)
WBC: 5.4 10*3/uL (ref 4.0–10.5)

## 2023-10-09 LAB — HEMOGLOBIN A1C: Hgb A1c MFr Bld: 5.1 % (ref 4.6–6.5)

## 2023-10-09 LAB — COMPREHENSIVE METABOLIC PANEL WITH GFR
ALT: 32 U/L (ref 0–53)
AST: 17 U/L (ref 0–37)
Albumin: 4.4 g/dL (ref 3.5–5.2)
Alkaline Phosphatase: 58 U/L (ref 39–117)
BUN: 14 mg/dL (ref 6–23)
CO2: 29 meq/L (ref 19–32)
Calcium: 9 mg/dL (ref 8.4–10.5)
Chloride: 102 meq/L (ref 96–112)
Creatinine, Ser: 0.98 mg/dL (ref 0.40–1.50)
GFR: 93.21 mL/min (ref >60.00)
Glucose, Bld: 95 mg/dL (ref 70–99)
Potassium: 4.1 meq/L (ref 3.5–5.1)
Sodium: 137 meq/L (ref 135–145)
Total Bilirubin: 1.2 mg/dL (ref 0.2–1.2)
Total Protein: 6.8 g/dL (ref 6.0–8.3)

## 2023-10-09 LAB — PSA: PSA: 1.11 ng/mL (ref 0.10–4.00)

## 2023-10-09 LAB — LIPID PANEL
Cholesterol: 181 mg/dL (ref 0–200)
HDL: 38.8 mg/dL — ABNORMAL LOW (ref >39.00)
LDL Cholesterol: 122 mg/dL — ABNORMAL HIGH (ref 0–99)
NonHDL: 142.62
Total CHOL/HDL Ratio: 5
Triglycerides: 105 mg/dL (ref 0.0–149.0)
VLDL: 21 mg/dL (ref 0.0–40.0)

## 2023-10-09 NOTE — Patient Instructions (Addendum)
 [facility] contact Please call to schedule visit and/or procedure IF you do not hear within a week Address: [address] [place], [place], [place] [address] Phone: [phone]   Please stop by lab before you go If you have mychart- we will send your results within 3 business days of us  receiving them.  If you do not have mychart- we will call you about results within 5 business days of us  receiving them.  *please also note that you will see labs on mychart as soon as they post. I will later go in and write notes on them- will say "notes from Dr. Arlene Ben"   Recommended follow up: Return in about 1 year (around 10/08/2024) for physical or sooner if needed.Schedule b4 you leave.

## 2023-10-09 NOTE — Progress Notes (Signed)
 Phone: [phone]   Subjective:  Patient presents today for their annual physical. Chief complaint-noted.   See problem oriented charting- ROS- full  review of systems was completed and negative  Per full ROS sheet completed by patient other than left nose irritation- see below  The following were reviewed and entered/updated in epic: Past Medical History:  Diagnosis Date   L4-L5 disc bulge    Pt states he has rec'd 2 injections 6 months apart, last one was 10/28/15   MRSA infection    infected hair follice in nose. now cleared   Patient Active Problem List   Diagnosis Date Noted   Hyperlipidemia 11/19/2016   L4-L5 disc bulge    Past Surgical History:  Procedure Laterality Date   EXTRACORPOREAL SHOCK WAVE LITHOTRIPSY Left 07/09/2022   Procedure: LEFT EXTRACORPOREAL SHOCK WAVE LITHOTRIPSY (ESWL);  Surgeon: Mallie Seal, MD;  Location: [facility];  Service: Urology;  Laterality: Left;   HERNIA REPAIR Bilateral    left 1986, right 2005    Family History  Problem Relation Age of Onset   Hypertension Father    Diabetes Father    Prostate cancer Father        age 97   Diabetes Maternal Grandmother    Stroke Maternal Grandfather    Breast cancer Paternal Grandmother    Heart disease Paternal Grandfather 32       smoker    Medications- reviewed and updated Current Outpatient Medications  Medication Sig Dispense Refill   Cream Base (ATREVIS HYDROGEL EX)      Emollient (CERAVE AM SPF 30 EX) Apply topically.     methocarbamol (ROBAXIN) 750 MG tablet Take 1 tablet by mouth as needed.  (Patient not taking: Reported on 10/09/2023)  0   No current facility-administered medications for this visit.    Allergies-reviewed and updated No Known Allergies  Social History   Social History Narrative   Single. Long term partner. May or may not get married. No kids.    Partner's brother also lives with them.       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 Oropharynx normal Neck: no thyromegaly CV: RRR no murmurs rubs or gallops Lungs: CTAB no crackles, wheeze, rhonchi Abdomen: soft/nontender/nondistended/normal bowel sounds. No rebound or guarding.  Ext: no edema Skin: warm, dry Neuro: grossly normal, moves all extremities, PERRLA    Assessment and Plan  48 y.o. male presenting for annual physical.  Health Maintenance counseling: 1. Anticipatory guidance: Patient counseled regarding regular dental exams q6 months, eye exams -yearly,  avoiding smoking and second hand smoke , limiting alcohol to 2 beverages per day - not drinking lately at all other than 1-2 a year, no illicit drugs .   2. Risk factor reduction:  Advised patient of need for regular exercise and diet rich and fruits and vegetables to reduce risk of heart attack and stroke.  Exercise- trying to get back into 3 days a week at least when in town but fair amount of travel in winter- will be better in summer.  Diet/weight management-down 15  lbs in last year- started half plate method and has been very helpful and tries to remain hydrated.  Wt Readings from Last 3 Encounters:  10/09/23 191 lb 3.2 oz (86.7 kg)  10/08/22 206 lb 12.8 oz (93.8 kg)  07/09/22 201 lb 14.4 oz (91.6 kg)  3. Immunizations/screenings/ancillary studies- up to date other than opts out further COVID 19 vaccines Immunization History  Administered Date(s) Administered   Hep A / Hep B 11/16/2016, 12/20/2016, 05/22/2017   Influenza,inj,Quad PF,6+ Mos 05/22/2017, 03/28/2018, 02/29/2020   Influenza-Unspecified 03/28/2016   PFIZER(Purple Top)SARS-COV-2 Vaccination 10/17/2019, 11/07/2019    Tdap 11/04/2015  4. Prostate cancer screening-   father with prostate cancer history so we will continue to monitor PSA. He is also on testosterone  - had checked in January. Also had rectal exam with urology which was low risk per report  Lab Results  Component Value Date   PSA 0.96 10/08/2022   PSA 1.08 12/11/2021   PSA 1.07 10/05/2021   5. Colon cancer screening - refer for colonoscopy 6. Skin cancer screening- still seeing dermatology - visit soon. advised regular sunscreen use. Denies worrisome, changing, or new skin lesions - other than nose.  7. Smoking associated screening (lung cancer screening, AAA screen 65-75, UA)- never smoker 8. STD screening - opts in- monogamous but wants to be safe  Status of chronic or acute concerns   #hyperlipidemia- ascvd risk has been below 7.5%- 2% basically S: Medication:none  Lab Results  Component Value Date   CHOL 219 (H) 10/08/2022   HDL 44.20 10/08/2022   LDLCALC 112 (H) 10/05/2021   LDLDIRECT 145.0 10/08/2022   TRIG 233.0 (H) 10/08/2022   CHOLHDL 5 10/08/2022  A/P: update lipids may have improved with weight loss  # Low back pain/bulging disc S: has seen Dr. Rexanne Catalina in past about every 8 months - closer to a year most recenly . As needed robaxin.  A/P: doing well lately maybe 2 mild flares a year- not as bad as previous   #Left nostril - about a week ago but got bad on Monday- started mupirocin topically which he had at home- has been helpful. Also was taking 4 days of doxycycline  #Strained right middle finger making bed- some swelling around the proximal interphalangeal joint - but mild- wants to monitor- good strength and Range of motion   Recommended follow up: Return in about 1 year (around 10/08/2024) for physical or sooner if needed.Schedule b4 you leave.  Lab/Order associations: fasting   ICD-10-CM   1. Preventative health care  Z00.00     2. Screen for colon cancer  Z12.11 Ambulatory referral to Gastroenterology    3.  Screening for diabetes mellitus  Z13.1 Hemoglobin A1c    4. Overweight  E66.3 Hemoglobin A1c    5. Hyperlipidemia, unspecified hyperlipidemia type  E78.5 Comprehensive metabolic panel with GFR    CBC with Differential/Platelet    Lipid panel    6. Screening for prostate cancer  Z12.5 PSA    7. Screening for HIV (human immunodeficiency virus)  Z11.4 HIV Antibody (routine testing w rflx)    8. Screening examination for venereal disease  Z11.3 RPR    9. Screening for gonorrhea  Z11.3 Urine cytology ancillary only    10. Screening for chlamydial disease  Z11.8 Urine cytology ancillary only      No orders of the defined types were placed in this encounter.   Return precautions advised.  Clarisa Crooked, MD

## 2023-10-10 ENCOUNTER — Encounter: Payer: Self-pay | Admitting: Family Medicine

## 2023-10-10 LAB — RPR: RPR Ser Ql: NONREACTIVE

## 2023-10-10 LAB — URINE CYTOLOGY ANCILLARY ONLY
Chlamydia: NEGATIVE
Comment: NEGATIVE
Comment: NEGATIVE
Comment: NORMAL
Neisseria Gonorrhea: NEGATIVE
Trichomonas: NEGATIVE

## 2023-10-10 LAB — HIV ANTIBODY (ROUTINE TESTING W REFLEX): HIV 1&2 Ab, 4th Generation: NONREACTIVE

## 2024-01-10 ENCOUNTER — Other Ambulatory Visit: Payer: Self-pay | Admitting: Medical Genetics

## 2024-01-13 ENCOUNTER — Ambulatory Visit (AMBULATORY_SURGERY_CENTER)

## 2024-01-13 ENCOUNTER — Encounter: Payer: Self-pay | Admitting: Gastroenterology

## 2024-01-13 VITALS — Ht 72.0 in | Wt 190.0 lb

## 2024-01-13 DIAGNOSIS — Z1211 Encounter for screening for malignant neoplasm of colon: Secondary | ICD-10-CM

## 2024-01-13 MED ORDER — NA SULFATE-K SULFATE-MG SULF 17.5-3.13-1.6 GM/177ML PO SOLN: 1.0000 | Freq: Once | ORAL | 0 refills | Status: AC

## 2024-01-13 NOTE — Progress Notes (Signed)

## 2024-01-29 ENCOUNTER — Ambulatory Visit: Admitting: Gastroenterology

## 2024-01-29 ENCOUNTER — Encounter: Payer: Self-pay | Admitting: Gastroenterology

## 2024-01-29 VITALS — BP 122/88 | HR 73 | Temp 98.1°F | Resp 13 | Ht 72.0 in | Wt 190.0 lb

## 2024-01-29 DIAGNOSIS — D128 Benign neoplasm of rectum: Secondary | ICD-10-CM

## 2024-01-29 DIAGNOSIS — K6282 Dysplasia of anus: Secondary | ICD-10-CM

## 2024-01-29 DIAGNOSIS — Z1211 Encounter for screening for malignant neoplasm of colon: Secondary | ICD-10-CM

## 2024-01-29 MED ORDER — SODIUM CHLORIDE 0.9 % IV SOLN: 500.0000 mL | INTRAVENOUS | Status: DC

## 2024-01-29 NOTE — Progress Notes (Signed)
 Pt's states no medical or surgical changes since previsit or office visit.

## 2024-01-29 NOTE — Progress Notes (Signed)
 Called to room to assist during endoscopic procedure.  Patient ID and intended procedure confirmed with present staff. Received instructions for my participation in the procedure from the performing physician.

## 2024-01-29 NOTE — Progress Notes (Signed)
 History and Physical:  This patient presents for endoscopic testing for: Encounter Diagnosis  Name Primary?   Special screening for malignant neoplasms, colon Yes    Average risk for colorectal cancer.  1st screening exam.  Patient denies chronic abdominal pain, rectal bleeding, constipation or diarrhea.   Patient is otherwise without complaints or active issues today.   Past Medical History: Past Medical History:  Diagnosis Date   Kidney stones    L4-L5 disc bulge    Pt states he has rec'd 2 injections 6 months apart, last one was 10/28/15   MRSA infection    infected hair follice in nose. now cleared     Past Surgical History: Past Surgical History:  Procedure Laterality Date   EXTRACORPOREAL SHOCK WAVE LITHOTRIPSY Left 07/09/2022   Procedure: LEFT EXTRACORPOREAL SHOCK WAVE LITHOTRIPSY (ESWL);  Surgeon: Lovie Arlyss CROME, MD;  Location: [facility];  Service: Urology;  Laterality: Left;   HERNIA REPAIR Bilateral    left 1986, right 2005    Allergies: No Known Allergies  Outpatient Meds: Current Outpatient Medications  Medication Sig Dispense Refill   Cream Base (ATREVIS HYDROGEL EX)      Na Sulfate-K Sulfate-Mg Sulfate concentrate (SUPREP) 17.5-3.13-1.6 GM/177ML SOLN SMARTSIG:1 Kit(s) By Mouth Once     Current Facility-Administered Medications  Medication Dose Route Frequency Provider Last Rate Last Admin   0.9 %  sodium chloride  infusion  500 mL Intravenous Continuous Danis, Victory CROME III, MD          ___________________________________________________________________ Objective   Exam:  BP (!) 144/94   Pulse 90   Temp 98.1 F (36.7 C) (Temporal)   Ht 6' (1.829 m)   Wt 190 lb (86.2 kg)   SpO2 94%   BMI 25.77 kg/m   CV: regular , S1/S2 Resp: clear to auscultation bilaterally, normal RR and effort noted GI: soft, no tenderness, with active bowel sounds.   Assessment: Encounter Diagnosis  Name Primary?   Special screening for malignant  neoplasms, colon Yes     Plan: Colonoscopy   The benefits and risks of the planned procedure(s) were described in detail with the patient or (when appropriate) their health care proxy.  Risks were outlined as including, but not limited to, bleeding, infection, perforation, adverse medication reaction leading to cardiac or pulmonary decompensation, pancreatitis (if ERCP).  The limitation of incomplete mucosal visualization was also discussed.  No guarantees or warranties were given.  The patient is appropriate for an endoscopic procedure in the ambulatory setting.   - Victory Brand, MD

## 2024-01-29 NOTE — Progress Notes (Signed)
 Sedate, gd SR, tolerated procedure well, VSS, report to RN

## 2024-01-29 NOTE — Patient Instructions (Addendum)

## 2024-01-29 NOTE — Op Note (Signed)
 [facility] Patient Name: Ronald Tyler Procedure Date: 01/29/2024 9:25 AM MRN: [id number] Endoscopist: Victory L. Legrand , MD, [id number] Age: 48 Referring MD:  Date of Birth: [birth year] Gender: Male Account #: [id number] Procedure:                Colonoscopy Indications:              Screening for colorectal malignant neoplasm, This                            is the patient's first colonoscopy Medicines:                Monitored Anesthesia Care Procedure:                Pre-Anesthesia Assessment:                           - Prior to the procedure, a History and Physical                            was performed, and patient medications and                            allergies were reviewed. The patient's tolerance of                            previous anesthesia was also reviewed. The risks                            and benefits of the procedure and the sedation                            options and risks were discussed with the patient.                            All questions were answered, and informed consent                            was obtained. Prior Anticoagulants: The patient has                            taken no anticoagulant or antiplatelet agents. ASA                            Grade Assessment: I - A normal, healthy patient.                            After reviewing the risks and benefits, the patient                            was deemed in satisfactory condition to undergo the                            procedure.  After obtaining informed consent, the colonoscope                            was passed under direct vision. Throughout the                            procedure, the patient's blood pressure, pulse, and                            oxygen saturations were monitored continuously. The                            Olympus Scope [id number] was introduced through the                            anus and advanced to the the  cecum, identified by                            appendiceal orifice and ileocecal valve. The                            colonoscopy was performed without difficulty. The                            patient tolerated the procedure well. The quality                            of the bowel preparation was good after lavage. The                            ileocecal valve, appendiceal orifice, and rectum                            were photographed. Scope In: 9:27:18 AM Scope Out: 10:00:41 AM Scope Withdrawal Time: 0 hours 29 minutes 19 seconds  Total Procedure Duration: 0 hours 33 minutes 23 seconds  Findings:                 The perianal and digital rectal examinations were                            normal.                           Repeat examination of right colon under NBI                            performed.                           Two small,multi-lobulated and sessile polyps were                            found in the distal rectum. Biopsies were taken  with a cold forceps for histology (from one of the                            2 lesions).                           These 2 lesions were at approximately the 7:00 and                            9:00 positions very close to the anal mucosa                            (within 1 to 2 mm). They were 10 to 12 mm in                            greatest dimension. Location and morphology of them                            precluded snare polypectomy.                           3 biopsies were taken from the lesion at 7:00,                            removing most but probably not all of the tissue.                            These biopsies led to about 5 minutes of oozing                            that spontaneously stopped. The other lesion was                            neither biopsied nor removed.                           The exam was otherwise without abnormality on                            direct and  retroflexion views. Complications:            No immediate complications. Estimated Blood Loss:     Estimated blood loss was minimal. Impression:               - Two polyps in the distal rectum. Biopsied.                           - The examination was otherwise normal on direct                            and retroflexion views. Recommendation:           - Patient has a contact number available for  emergencies. The signs and symptoms of potential                            delayed complications were discussed with the                            patient. Return to normal activities tomorrow.                            Written discharge instructions were provided to the                            patient.                           - Resume previous diet.                           - Continue present medications.                           - Await pathology results.                           - If biopsies confirmed adenoma or AIN, refer to                            colorectal surgery for transanal excision. Teiana Hajduk L. Legrand, MD 01/29/2024 10:10:16 AM This report has been signed electronically.

## 2024-01-30 ENCOUNTER — Telehealth: Payer: Self-pay | Admitting: *Deleted

## 2024-01-30 NOTE — Telephone Encounter (Signed)
 No answer after post call. Mailbox is full, unable to leave a message.

## 2024-01-31 LAB — SURGICAL PATHOLOGY

## 2024-02-09 ENCOUNTER — Ambulatory Visit: Payer: Self-pay | Admitting: Gastroenterology

## 2024-02-12 NOTE — Telephone Encounter (Signed)
 Confirmation fax received for referral sent to [facility].

## 2024-03-23 ENCOUNTER — Ambulatory Visit: Payer: Self-pay | Admitting: General Surgery

## 2024-03-23 NOTE — H&P (View-Only) (Signed)
 REFERRING PHYSICIAN:  Legrand Victory Morton DOUGLAS,*  PROVIDER:  BERNARDA WANDA NED, MD  MRN: [id number] DOB: 31-Mar-1976 DATE OF ENCOUNTER: 03/23/2024  Subjective   Chief Complaint: New Consultation (poss transanal excision/low grade AIN)     History of Present Illness: Ronald Tyler is a 48 y.o. male who is seen today as an office consultation at the request of Dr. Legrand for evaluation of New Consultation (poss transanal excision/low grade AIN) .    48 year old male who presents to the office for evaluation of anal lesion seen during colonoscopy.  Biopsies show AIN 1 (condyloma).   Review of Systems: A complete review of systems was obtained from the patient.  I have reviewed this information and discussed as appropriate with the patient.  See HPI as well for other ROS.   Medical History: Past Medical History:  Diagnosis Date   Kidney stones    L4-L5 disc bulge    MRSA infection     There is no problem list on file for this patient.   Past Surgical History:  Procedure Laterality Date   Extracorporeal shock wave lithotripsy Left 07/09/2022   HERNIA REPAIR Bilateral      No Known Allergies  No current outpatient medications on file prior to visit.   No current facility-administered medications on file prior to visit.    Family History  Problem Relation Age of Onset   Diabetes Father    High blood pressure (Hypertension) Father    Prostate cancer Father    Esophageal cancer Maternal Uncle    Diabetes Maternal Grandmother    Stroke Maternal Grandfather    Breast cancer Paternal Grandmother    Heart disease Paternal Grandfather    Rectal cancer Neg Hx    Stomach cancer Neg Hx    Colon cancer Neg Hx      Social History   Tobacco Use  Smoking Status Never  Smokeless Tobacco Never     Social History   Socioeconomic History   Marital status: Single  Tobacco Use   Smoking status: Never   Smokeless tobacco: Never  Substance and Sexual Activity    Alcohol use: Never   Drug use: Never   Sexual activity: Defer   Social Drivers of Health   Food Insecurity: Low Risk  (12/28/2022)   Received from [facility]   Hunger Vital Sign    Within the past 12 months, you worried that your food would run out before you got money to buy more: Never true    Within the past 12 months, the food you bought just didn't last and you didn't have money to get more. : Never true  Transportation Needs: No Transportation Needs (12/28/2022)   Received from [facility]    In the past 12 months, has lack of reliable transportation kept you from medical appointments, meetings, work or from getting things needed for daily living? : No  Housing Stability: Unknown (03/23/2024)   Housing Stability Vital Sign    Homeless in the Last Year: No    Objective:    Vitals:   03/23/24 0909  BP: 127/87  Pulse: 82  Temp: 36.9 C (98.5 F)  TempSrc: Temporal  SpO2: 91%  Weight: 93.5 kg (206 lb 3.2 oz)  Height: 182.9 cm (6')  PainSc: 0-No pain     Exam Gen: NAD Abd: soft    Labs, Imaging and Diagnostic Testing: Colonoscopy report and images reviewed Pathology report reviewed.  Assessment and Plan:  AIN (anal intraepithelial neoplasia)  anal canal  (primary encounter diagnosis)  47 year old male with condyloma noted on anal exam during colonoscopy.  It sounds like most of these lesions were removed but there is concern that there may be residual disease.  We discussed proceeding to the OR for laser ablation of any remaining lesions.  We discussed this in detail today including chances of recurrence as well as postoperative pain and bleeding.  All questions were answered.  Patient would like to get this scheduled.  Bernarda [name], MD Colon and Rectal Surgery [facility]

## 2024-03-23 NOTE — H&P (Signed)
 REFERRING PHYSICIAN:  Legrand Victory Morton DOUGLAS,*  PROVIDER:  BERNARDA WANDA NED, MD  MRN: [id number] DOB: 31-Mar-1976 DATE OF ENCOUNTER: 03/23/2024  Subjective   Chief Complaint: New Consultation (poss transanal excision/low grade AIN)     History of Present Illness: Ronald Tyler is a 48 y.o. male who is seen today as an office consultation at the request of Dr. Legrand for evaluation of New Consultation (poss transanal excision/low grade AIN) .    48 year old male who presents to the office for evaluation of anal lesion seen during colonoscopy.  Biopsies show AIN 1 (condyloma).   Review of Systems: A complete review of systems was obtained from the patient.  I have reviewed this information and discussed as appropriate with the patient.  See HPI as well for other ROS.   Medical History: Past Medical History:  Diagnosis Date   Kidney stones    L4-L5 disc bulge    MRSA infection     There is no problem list on file for this patient.   Past Surgical History:  Procedure Laterality Date   Extracorporeal shock wave lithotripsy Left 07/09/2022   HERNIA REPAIR Bilateral      No Known Allergies  No current outpatient medications on file prior to visit.   No current facility-administered medications on file prior to visit.    Family History  Problem Relation Age of Onset   Diabetes Father    High blood pressure (Hypertension) Father    Prostate cancer Father    Esophageal cancer Maternal Uncle    Diabetes Maternal Grandmother    Stroke Maternal Grandfather    Breast cancer Paternal Grandmother    Heart disease Paternal Grandfather    Rectal cancer Neg Hx    Stomach cancer Neg Hx    Colon cancer Neg Hx      Social History   Tobacco Use  Smoking Status Never  Smokeless Tobacco Never     Social History   Socioeconomic History   Marital status: Single  Tobacco Use   Smoking status: Never   Smokeless tobacco: Never  Substance and Sexual Activity    Alcohol use: Never   Drug use: Never   Sexual activity: Defer   Social Drivers of Health   Food Insecurity: Low Risk  (12/28/2022)   Received from [facility]   Hunger Vital Sign    Within the past 12 months, you worried that your food would run out before you got money to buy more: Never true    Within the past 12 months, the food you bought just didn't last and you didn't have money to get more. : Never true  Transportation Needs: No Transportation Needs (12/28/2022)   Received from [facility]    In the past 12 months, has lack of reliable transportation kept you from medical appointments, meetings, work or from getting things needed for daily living? : No  Housing Stability: Unknown (03/23/2024)   Housing Stability Vital Sign    Homeless in the Last Year: No    Objective:    Vitals:   03/23/24 0909  BP: 127/87  Pulse: 82  Temp: 36.9 C (98.5 F)  TempSrc: Temporal  SpO2: 91%  Weight: 93.5 kg (206 lb 3.2 oz)  Height: 182.9 cm (6')  PainSc: 0-No pain     Exam Gen: NAD Abd: soft    Labs, Imaging and Diagnostic Testing: Colonoscopy report and images reviewed Pathology report reviewed.  Assessment and Plan:  AIN (anal intraepithelial neoplasia)  anal canal  (primary encounter diagnosis)  47 year old male with condyloma noted on anal exam during colonoscopy.  It sounds like most of these lesions were removed but there is concern that there may be residual disease.  We discussed proceeding to the OR for laser ablation of any remaining lesions.  We discussed this in detail today including chances of recurrence as well as postoperative pain and bleeding.  All questions were answered.  Patient would like to get this scheduled.  Bernarda [name], MD Colon and Rectal Surgery [facility]

## 2024-03-31 ENCOUNTER — Encounter: Payer: Self-pay | Admitting: Family Medicine

## 2024-04-05 ENCOUNTER — Other Ambulatory Visit: Payer: Self-pay | Admitting: Medical Genetics

## 2024-04-05 DIAGNOSIS — Z006 Encounter for examination for normal comparison and control in clinical research program: Secondary | ICD-10-CM

## 2024-04-08 NOTE — Progress Notes (Addendum)
 Anesthesia Review:  PCP: Garnette Lukes  LOV 10/09/23  Cardiologist : none    PPM/ ICD: Device Orders: Rep Notified:  Chest x-ray : EKG : Echo : Stress test: Cardiac Cath :   Activity level: can do a flight of stairs without difficulty  Sleep Study/ CPAP : none  Fasting Blood Sugar :      / Checks Blood Sugar -- times a day:    Blood Thinner/ Instructions /Last Dose: ASA / Instructions/ Last Dose :    Blood pressure 133/102 at preop   PT denies any chest pain, shortness of breath, dizziness , headache or blurred vision.  PT states checks regularly at home and is always good but when goes to MD slightly elevated.     Hgb17.4 on 04/14/24- routed to DR Bernarda Ned.

## 2024-04-09 NOTE — Patient Instructions (Addendum)
 SURGICAL WAITING ROOM VISITATION  Patients having surgery or a procedure may have no more than 2 support people in the waiting area - these visitors may rotate.    Children under the age of 82 must have an adult with them who is not the patient.  Visitors with respiratory illnesses are discouraged from visiting and should remain at home.  If the patient needs to stay at the hospital during part of their recovery, the visitor guidelines for inpatient rooms apply. Pre-op nurse will coordinate an appropriate time for 1 support person to accompany patient in pre-op.  This support person may not rotate.    Please refer to the [facility] website for the visitor guidelines for Inpatients (after your surgery is over and you are in a regular room).       Your procedure is scheduled on:  04/15/2024    Report to [facility] Main Entrance    Report to admitting at  0800 AM   Call this number if you have problems the morning of surgery [phone]   Do not eat food   :After Midnight.   After Midnight you may have the following liquids until ___ 0700___ AM DAY OF SURGERY  Water Non-Citrus Juices (without pulp, NO RED-Apple, White grape, White cranberry) Black Coffee (NO MILK/CREAM OR CREAMERS, sugar ok)  Clear Tea (NO MILK/CREAM OR CREAMERS, sugar ok) regular and decaf                             Plain Jell-O (NO RED)                                           Fruit ices (not with fruit pulp, NO RED)                                     Popsicles (NO RED)                                                               Sports drinks like Gatorade (NO RED)                             If you have questions, please contact your surgeon's office.      Oral Hygiene is also important to reduce your risk of infection.                                    Remember - BRUSH YOUR TEETH THE MORNING OF SURGERY WITH YOUR REGULAR TOOTHPASTE  DENTURES WILL BE REMOVED PRIOR TO SURGERY PLEASE DO  NOT APPLY Poly grip OR ADHESIVES!!!   Do NOT smoke after Midnight   Stop all vitamins and herbal supplements 7 days before surgery.   Take these medicines the morning of surgery with A SIP OF WATER:  none   DO NOT TAKE ANY ORAL DIABETIC MEDICATIONS DAY OF YOUR SURGERY  Bring CPAP mask and  tubing day of surgery.                              You may not have any metal on your body including hair pins, jewelry, and body piercing             Do not wear make-up, lotions, powders, perfumes/cologne, or deodorant  Do not wear nail polish including gel and S&S, artificial/acrylic nails, or any other type of covering on natural nails including finger and toenails. If you have artificial nails, gel coating, etc. that needs to be removed by a nail salon please have this removed prior to surgery or surgery may need to be canceled/ delayed if the surgeon/ anesthesia feels like they are unable to be safely monitored.   Do not shave  48 hours prior to surgery.               Men may shave face and neck.   Do not bring valuables to the hospital. [facility] IS NOT             RESPONSIBLE   FOR VALUABLES.   Contacts, glasses, dentures or bridgework may not be worn into surgery.   Bring small overnight bag day of surgery.   DO NOT BRING YOUR HOME MEDICATIONS TO THE HOSPITAL. PHARMACY WILL DISPENSE MEDICATIONS LISTED ON YOUR MEDICATION LIST TO YOU DURING YOUR ADMISSION IN THE HOSPITAL!    Patients discharged on the day of surgery will not be allowed to drive home.  Someone NEEDS to stay with you for the first 24 hours after anesthesia.   Special Instructions: Bring a copy of your healthcare power of attorney and living will documents the day of surgery if you haven't scanned them before.              Please read over the following fact sheets you were given: IF YOU HAVE QUESTIONS ABOUT YOUR PRE-OP INSTRUCTIONS PLEASE CALL [phone].   If you received a COVID test during your pre-op visit  it is  requested that you wear a mask when out in public, stay away from anyone that may not be feeling well and notify your surgeon if you develop symptoms. If you test positive for Covid or have been in contact with anyone that has tested positive in the last 10 days please notify you surgeon.     - Preparing for Surgery Before surgery, you can play an important role.  Because skin is not sterile, your skin needs to be as free of germs as possible.  You can reduce the number of germs on your skin by washing with CHG (chlorahexidine gluconate) soap before surgery.  CHG is an antiseptic cleaner which kills germs and bonds with the skin to continue killing germs even after washing. Please DO NOT use if you have an allergy to CHG or antibacterial soaps.  If your skin becomes reddened/irritated stop using the CHG and inform your nurse when you arrive at Short Stay. Do not shave (including legs and underarms) for at least 48 hours prior to the first CHG shower.  You may shave your face/neck.  Please follow these instructions carefully:  1.  Shower with CHG Soap the night before surgery ONLY (DO NOT USE THE SOAP THE MORNING OF SURGERY).  2.  If you choose to wash your hair, wash your hair first as usual with your normal  shampoo.  3.  After you shampoo,  rinse your hair and body thoroughly to remove the shampoo.                             4.  Use CHG as you would any other liquid soap.  You can apply chg directly to the skin and wash.  Gently with a scrungie or clean washcloth.  5.  Apply the CHG Soap to your body ONLY FROM THE NECK DOWN.   Do   not use on face/ open                           Wound or open sores. Avoid contact with eyes, ears mouth and   genitals (private parts).                       Wash face,  Genitals (private parts) with your normal soap.             6.  Wash thoroughly, paying special attention to the area where your    surgery  will be performed.  7.  Thoroughly rinse your body  with warm water from the neck down.  8.  DO NOT shower/wash with your normal soap after using and rinsing off the CHG Soap.                9.  Pat yourself dry with a clean towel.            10.  Wear clean pajamas.            11.  Place clean sheets on your bed the night of your first shower and do not  sleep with pets. Day of Surgery : Do not apply any CHG, lotions/deodorants the morning of surgery.  Please wear clean clothes to the hospital/surgery center.  FAILURE TO FOLLOW THESE INSTRUCTIONS MAY RESULT IN THE CANCELLATION OF YOUR SURGERY  PATIENT SIGNATURE_________________________________  NURSE SIGNATURE__________________________________  ________________________________________________________________________

## 2024-04-14 ENCOUNTER — Other Ambulatory Visit: Payer: Self-pay

## 2024-04-14 ENCOUNTER — Encounter (HOSPITAL_COMMUNITY): Payer: Self-pay

## 2024-04-14 ENCOUNTER — Encounter (HOSPITAL_COMMUNITY)
Admission: RE | Admit: 2024-04-14 | Discharge: 2024-04-14 | Disposition: A | Discharge status: Home / Self Care | Source: Ambulatory Visit | Attending: General Surgery | Admitting: General Surgery

## 2024-04-14 VITALS — BP 133/102 | HR 75 | Temp 98.1°F | Resp 16 | Ht 72.0 in | Wt 197.0 lb

## 2024-04-14 DIAGNOSIS — Z01818 Encounter for other preprocedural examination: Secondary | ICD-10-CM

## 2024-04-14 DIAGNOSIS — Z01812 Encounter for preprocedural laboratory examination: Secondary | ICD-10-CM | POA: Insufficient documentation

## 2024-04-14 HISTORY — DX: Personal history of urinary calculi: Z87.442

## 2024-04-14 LAB — CBC
HCT: 50.8 % (ref 39.0–52.0)
Hemoglobin: 17.4 g/dL — ABNORMAL HIGH (ref 13.0–17.0)
MCH: 30.3 pg (ref 26.0–34.0)
MCHC: 34.3 g/dL (ref 30.0–36.0)
MCV: 88.3 fL (ref 80.0–100.0)
Platelets: 243 K/uL (ref 150–400)
RBC: 5.75 MIL/uL (ref 4.22–5.81)
RDW: 12.8 % (ref 11.5–15.5)
WBC: 5.7 K/uL (ref 4.0–10.5)
nRBC: 0 % (ref 0.0–0.2)

## 2024-04-15 ENCOUNTER — Ambulatory Visit (HOSPITAL_COMMUNITY)
Admission: RE | Admit: 2024-04-15 | Discharge: 2024-04-15 | Disposition: A | Discharge status: Home / Self Care | Attending: General Surgery | Admitting: General Surgery

## 2024-04-15 ENCOUNTER — Ambulatory Visit (HOSPITAL_COMMUNITY): Admitting: Anesthesiology

## 2024-04-15 ENCOUNTER — Encounter (HOSPITAL_COMMUNITY): Payer: Self-pay | Admitting: General Surgery

## 2024-04-15 ENCOUNTER — Other Ambulatory Visit: Payer: Self-pay

## 2024-04-15 ENCOUNTER — Encounter (HOSPITAL_COMMUNITY)
Admission: RE | Disposition: A | Discharge status: Home / Self Care | Payer: Self-pay | Source: Home / Self Care | Attending: General Surgery

## 2024-04-15 DIAGNOSIS — A630 Anogenital (venereal) warts: Secondary | ICD-10-CM | POA: Diagnosis not present

## 2024-04-15 DIAGNOSIS — K6282 Dysplasia of anus: Secondary | ICD-10-CM | POA: Insufficient documentation

## 2024-04-15 DIAGNOSIS — Z01818 Encounter for other preprocedural examination: Secondary | ICD-10-CM

## 2024-04-15 HISTORY — PX: LASER ABLATION CONDOLAMATA: SHX5941

## 2024-04-15 SURGERY — ABLATION, CONDYLOMA, USING LASER
Anesthesia: General | Site: Rectum

## 2024-04-15 MED ORDER — LIDOCAINE HCL (CARDIAC) PF 100 MG/5ML IV SOSY
PREFILLED_SYRINGE | INTRAVENOUS | Status: DC | PRN
  Administered 2024-04-15: 100 mg via INTRAVENOUS

## 2024-04-15 MED ORDER — 0.9 % SODIUM CHLORIDE (POUR BTL) OPTIME
TOPICAL | Status: DC | PRN
  Administered 2024-04-15: 1000 mL

## 2024-04-15 MED ORDER — FENTANYL CITRATE (PF) 50 MCG/ML IJ SOSY: 25.0000 ug | PREFILLED_SYRINGE | INTRAMUSCULAR | Status: DC | PRN

## 2024-04-15 MED ORDER — SODIUM CHLORIDE 0.9% FLUSH: 3.0000 mL | Freq: Two times a day (BID) | INTRAVENOUS | Status: DC

## 2024-04-15 MED ORDER — LACTATED RINGERS IV SOLN: INTRAVENOUS | Status: DC

## 2024-04-15 MED ORDER — OXYCODONE HCL 5 MG/5ML PO SOLN: 5.0000 mg | Freq: Once | ORAL | Status: DC | PRN

## 2024-04-15 MED ORDER — LIDOCAINE HCL (PF) 2 % IJ SOLN
INTRAMUSCULAR | Status: AC
Start: 2024-04-15 — End: 2024-04-15
  Filled 2024-04-15: qty 5

## 2024-04-15 MED ORDER — GABAPENTIN 300 MG PO CAPS
300.0000 mg | ORAL_CAPSULE | ORAL | Status: AC
  Administered 2024-04-15: 300 mg via ORAL
  Filled 2024-04-15: qty 1

## 2024-04-15 MED ORDER — ROCURONIUM BROMIDE 10 MG/ML (PF) SYRINGE
PREFILLED_SYRINGE | INTRAVENOUS | Status: AC
  Filled 2024-04-15: qty 10

## 2024-04-15 MED ORDER — GLYCOPYRROLATE 0.2 MG/ML IJ SOLN
INTRAMUSCULAR | Status: DC | PRN
  Administered 2024-04-15: .1 mg via INTRAVENOUS

## 2024-04-15 MED ORDER — ACETAMINOPHEN 650 MG RE SUPP: 650.0000 mg | RECTAL | Status: DC | PRN

## 2024-04-15 MED ORDER — DROPERIDOL 2.5 MG/ML IJ SOLN: 0.6250 mg | Freq: Once | INTRAMUSCULAR | Status: DC | PRN

## 2024-04-15 MED ORDER — FENTANYL CITRATE (PF) 100 MCG/2ML IJ SOLN
INTRAMUSCULAR | Status: AC
  Filled 2024-04-15: qty 2

## 2024-04-15 MED ORDER — ACETAMINOPHEN 500 MG PO TABS
1000.0000 mg | ORAL_TABLET | ORAL | Status: AC
  Administered 2024-04-15: 1000 mg via ORAL
  Filled 2024-04-15: qty 2

## 2024-04-15 MED ORDER — FENTANYL CITRATE (PF) 100 MCG/2ML IJ SOLN
INTRAMUSCULAR | Status: DC | PRN
  Administered 2024-04-15: 100 ug via INTRAVENOUS

## 2024-04-15 MED ORDER — TRAMADOL HCL 50 MG PO TABS: 50.0000 mg | ORAL_TABLET | Freq: Four times a day (QID) | ORAL | 0 refills | Status: AC | PRN

## 2024-04-15 MED ORDER — ACETIC ACID 5 % SOLN
Status: DC | PRN
Start: 2024-04-15 — End: 2024-04-15
  Administered 2024-04-15: 2 via TOPICAL

## 2024-04-15 MED ORDER — OXYCODONE HCL 5 MG PO TABS: 5.0000 mg | ORAL_TABLET | ORAL | Status: DC | PRN

## 2024-04-15 MED ORDER — DEXMEDETOMIDINE HCL IN NACL 80 MCG/20ML IV SOLN
INTRAVENOUS | Status: DC | PRN
  Administered 2024-04-15 (×2): 4 ug via INTRAVENOUS

## 2024-04-15 MED ORDER — SODIUM CHLORIDE 0.9% FLUSH: 3.0000 mL | INTRAVENOUS | Status: DC | PRN

## 2024-04-15 MED ORDER — ORAL CARE MOUTH RINSE: 15.0000 mL | Freq: Once | OROMUCOSAL | Status: AC

## 2024-04-15 MED ORDER — SILVER SULFADIAZINE 1 % EX CREA
1.0000 | TOPICAL_CREAM | Freq: Once | CUTANEOUS | Status: DC
  Filled 2024-04-15: qty 50

## 2024-04-15 MED ORDER — ONDANSETRON HCL 4 MG/2ML IJ SOLN
INTRAMUSCULAR | Status: DC | PRN
  Administered 2024-04-15: 4 mg via INTRAVENOUS

## 2024-04-15 MED ORDER — ACETIC ACID 5 % SOLN
Status: AC
  Filled 2024-04-15: qty 72

## 2024-04-15 MED ORDER — BUPIVACAINE-EPINEPHRINE 0.5% -1:200000 IJ SOLN
INTRAMUSCULAR | Status: DC | PRN
Start: 2024-04-15 — End: 2024-04-15
  Administered 2024-04-15: 30 mL

## 2024-04-15 MED ORDER — ROCURONIUM BROMIDE 100 MG/10ML IV SOLN
INTRAVENOUS | Status: DC | PRN
Start: 2024-04-15 — End: 2024-04-15
  Administered 2024-04-15: 60 mg via INTRAVENOUS

## 2024-04-15 MED ORDER — DEXAMETHASONE SOD PHOSPHATE PF 10 MG/ML IJ SOLN
INTRAMUSCULAR | Status: DC | PRN
  Administered 2024-04-15: 4 mg via INTRAVENOUS

## 2024-04-15 MED ORDER — SODIUM CHLORIDE 0.9 % IV SOLN
250.0000 mL | INTRAVENOUS | Status: DC | PRN
Start: 2024-04-15 — End: 2024-04-15

## 2024-04-15 MED ORDER — MIDAZOLAM HCL 2 MG/2ML IJ SOLN
INTRAMUSCULAR | Status: AC
Start: 2024-04-15 — End: 2024-04-15
  Filled 2024-04-15: qty 2

## 2024-04-15 MED ORDER — BUPIVACAINE-EPINEPHRINE (PF) 0.5% -1:200000 IJ SOLN
INTRAMUSCULAR | Status: AC
  Filled 2024-04-15: qty 30

## 2024-04-15 MED ORDER — CELECOXIB 200 MG PO CAPS
200.0000 mg | ORAL_CAPSULE | ORAL | Status: AC
  Administered 2024-04-15: 200 mg via ORAL
  Filled 2024-04-15: qty 1

## 2024-04-15 MED ORDER — PROPOFOL 10 MG/ML IV BOLUS
INTRAVENOUS | Status: DC | PRN
  Administered 2024-04-15: 200 mg via INTRAVENOUS

## 2024-04-15 MED ORDER — PROPOFOL 10 MG/ML IV BOLUS
INTRAVENOUS | Status: AC
Start: 2024-04-15 — End: 2024-04-15
  Filled 2024-04-15: qty 20

## 2024-04-15 MED ORDER — ONDANSETRON HCL 4 MG/2ML IJ SOLN
INTRAMUSCULAR | Status: AC
  Filled 2024-04-15: qty 2

## 2024-04-15 MED ORDER — SUGAMMADEX SODIUM 200 MG/2ML IV SOLN
INTRAVENOUS | Status: AC
  Filled 2024-04-15: qty 2

## 2024-04-15 MED ORDER — CHLORHEXIDINE GLUCONATE 0.12 % MT SOLN
15.0000 mL | Freq: Once | OROMUCOSAL | Status: AC
  Administered 2024-04-15: 15 mL via OROMUCOSAL

## 2024-04-15 MED ORDER — SUGAMMADEX SODIUM 200 MG/2ML IV SOLN
INTRAVENOUS | Status: DC | PRN
  Administered 2024-04-15: 200 mg via INTRAVENOUS

## 2024-04-15 MED ORDER — ACETAMINOPHEN 325 MG PO TABS: 650.0000 mg | ORAL_TABLET | ORAL | Status: DC | PRN

## 2024-04-15 MED ORDER — MIDAZOLAM HCL 5 MG/5ML IJ SOLN
INTRAMUSCULAR | Status: DC | PRN
  Administered 2024-04-15: 2 mg via INTRAVENOUS

## 2024-04-15 MED ORDER — OXYCODONE HCL 5 MG PO TABS: 5.0000 mg | ORAL_TABLET | Freq: Once | ORAL | Status: DC | PRN

## 2024-04-15 SURGICAL SUPPLY — 39 items
BAG COUNTER SPONGE SURGICOUNT (BAG) IMPLANT
BLADE HEX COATED 2.75 (ELECTRODE) ×1 IMPLANT
BLADE SURG 15 STRL LF DISP TIS (BLADE) ×1 IMPLANT
BRIEF MESH DISP LRG (UNDERPADS AND DIAPERS) ×1 IMPLANT
COVER BACK TABLE 60X90IN (DRAPES) ×1 IMPLANT
COVER MAYO STAND STRL (DRAPES) ×1 IMPLANT
COVER SURGICAL LIGHT HANDLE (MISCELLANEOUS) ×1 IMPLANT
DRAPE LAPAROTOMY T 98X78 PEDS (DRAPES) ×1 IMPLANT
DRAPE SHEET LG 3/4 BI-LAMINATE (DRAPES) IMPLANT
DRAPE UTILITY XL STRL (DRAPES) ×1 IMPLANT
DRSG VASELINE 3X18 (GAUZE/BANDAGES/DRESSINGS) IMPLANT
ELECT REM PT RETURN 15FT ADLT (MISCELLANEOUS) ×1 IMPLANT
GAUZE 4X4 16PLY ~~LOC~~+RFID DBL (SPONGE) IMPLANT
GAUZE PAD ABD 8X10 STRL (GAUZE/BANDAGES/DRESSINGS) ×1 IMPLANT
GAUZE SPONGE 4X4 12PLY STRL (GAUZE/BANDAGES/DRESSINGS) IMPLANT
GLOVE BIO SURGEON STRL SZ 6.5 (GLOVE) ×1 IMPLANT
GLOVE INDICATOR 6.5 STRL GRN (GLOVE) ×1 IMPLANT
GOWN STRL REUS W/ TWL LRG LVL3 (GOWN DISPOSABLE) ×1 IMPLANT
GOWN STRL REUS W/ TWL XL LVL3 (GOWN DISPOSABLE) ×2 IMPLANT
KIT BASIN OR (CUSTOM PROCEDURE TRAY) ×1 IMPLANT
KIT TURNOVER KIT A (KITS) ×1 IMPLANT
MANIFOLD NEPTUNE II (INSTRUMENTS) IMPLANT
NDL HYPO 25X1 1.5 SAFETY (NEEDLE) ×1 IMPLANT
NDL SAFETY ECLIPSE 18X1.5 (NEEDLE) IMPLANT
NEEDLE HYPO 25X1 1.5 SAFETY (NEEDLE) ×1 IMPLANT
PENCIL SMOKE EVACUATOR (MISCELLANEOUS) IMPLANT
PROTECTOR NERVE ULNAR (MISCELLANEOUS) IMPLANT
SPIKE FLUID TRANSFER (MISCELLANEOUS) ×1 IMPLANT
SPONGE SURGIFOAM ABS GEL 12-7 (HEMOSTASIS) IMPLANT
SUT CHROMIC 2 0 SH (SUTURE) IMPLANT
SUT CHROMIC 3 0 SH 27 (SUTURE) IMPLANT
SUT MON AB 3-0 SH27 (SUTURE) IMPLANT
SUT VIC AB 4-0 P-3 18XBRD (SUTURE) IMPLANT
SYR CONTROL 10ML LL (SYRINGE) ×1 IMPLANT
TOWEL OR 17X26 10 PK STRL BLUE (TOWEL DISPOSABLE) ×2 IMPLANT
TUBING CONNECTING 10 (TUBING) ×1 IMPLANT
VACUUM HOSE 7/8X10 W/ WAND (MISCELLANEOUS) ×1 IMPLANT
WATER STERILE IRR 500ML POUR (IV SOLUTION) ×1 IMPLANT
YANKAUER SUCT BULB TIP NO VENT (SUCTIONS) ×1 IMPLANT

## 2024-04-15 NOTE — Op Note (Signed)
 04/15/2024  10:37 AM  PATIENT:  Ronald Tyler  48 y.o. male  Patient Care Team: Katrinka Garnette KIDD, MD as PCP - General (Family Medicine) Dow Maxwell, PT as Physical Therapist (Physical Therapy)  PRE-OPERATIVE DIAGNOSIS:  ANAL CANAL CONDYLOMA  POST-OPERATIVE DIAGNOSIS:  ANAL CANAL CONDYLOMA  PROCEDURE:  LASER ABLATION CONDYLOMA    Surgeon(s): Debby Hila, MD  ASSISTANT: none   ANESTHESIA:   local and general  SPECIMEN:  Source of Specimen:  anterior anal canal  DISPOSITION OF SPECIMEN:  PATHOLOGY  COUNTS:  YES  PLAN OF CARE: Discharge to home after PACU  PATIENT DISPOSITION:  PACU - hemodynamically stable.  INDICATION: 48 y.o. M with anal canal lesions noted on colonoscopy.  Biopsy showed AIN 1   OR FINDINGS: L and R anterior anal canal lesions just proximal to the dentate line  DESCRIPTION: the patient was identified in the preoperative holding area and taken to the OR where they were laid on the operating room table.  General anesthesia was induced without difficulty. The patient was then positioned in prone jackknife position with buttocks gently taped apart.  The patient was then prepped and draped in usual sterile fashion.  SCDs were noted to be in place prior to the initiation of anesthesia. A surgical timeout was performed indicating the correct patient, procedure, positioning and need for preoperative antibiotics.  A rectal block was performed using Marcaine with epinephrine.  There were 2 clusters of condyloma noted anteriorly.  I placed an acetic acid soaked sponge inside the anal canal and allowed this to site for ~2 min.  I re-evaluated the anal canal and no other lesions were noted.  A small specimen was removed for pathology.  Next the laser was brought onto the field.  The edges of the operative field were draped with wet towels.  Appropriate ventilation was obtained.  All staff were protected with small particle masks and goggles safe for the  laser.  The laser was then activated. All condylomatous lesions were ablated. Hemostasis was then achieved using electrocautery and a interrupted 3-0 Chromic suture.  A dressing was applied over this. The patient was then awakened from anesthesia and sent to the postanesthesia care unit in stable condition. All counts were correct operating room staff.   Hila JAYSON Debby, MD  Colorectal and General Surgery [facility]

## 2024-04-15 NOTE — Anesthesia Procedure Notes (Signed)
 Procedure Name: Intubation Date/Time: 04/15/2024 10:04 AM  Performed by: Erick Fitz, CRNAPre-anesthesia Checklist: Patient identified, Emergency Drugs available, Suction available, Patient being monitored and Timeout performed Patient Re-evaluated:Patient Re-evaluated prior to induction Oxygen Delivery Method: Circle system utilized Preoxygenation: Pre-oxygenation with 100% oxygen Induction Type: IV induction Ventilation: Mask ventilation without difficulty Laryngoscope Size: Mac and 4 Grade View: Grade I Tube type: Oral Tube size: 7.5 mm Number of attempts: 1 Airway Equipment and Method: Stylet Placement Confirmation: ETT inserted through vocal cords under direct vision, positive ETCO2, CO2 detector and breath sounds checked- equal and bilateral Secured at: 21 cm Tube secured with: Tape (secured with 1/2 inch white silk tape) Dental Injury: Teeth and Oropharynx as per pre-operative assessment

## 2024-04-15 NOTE — Interval H&P Note (Signed)
 History and Physical Interval Note:  04/15/2024 8:11 AM  Ronald Tyler  has presented today for surgery, with the diagnosis of AIN ANAL CANAL.  The various methods of treatment have been discussed with the patient and family. After consideration of risks, benefits and other options for treatment, the patient has consented to  Procedure(s): ABLATION, CONDYLOMA, USING LASER (N/A) as a surgical intervention.  The patient's history has been reviewed, patient examined, no change in status, stable for surgery.  I have reviewed the patient's chart and labs.  Questions were answered to the patient's satisfaction.     Bernarda [name], MD  Colorectal and General Surgery [facility]

## 2024-04-15 NOTE — Discharge Instructions (Addendum)

## 2024-04-15 NOTE — Anesthesia Preprocedure Evaluation (Signed)
 Anesthesia Evaluation  Patient identified by MRN, date of birth, ID band Patient awake    Reviewed: Allergy & Precautions, NPO status , Patient's Chart, lab work & pertinent test results  History of Anesthesia Complications Negative for: history of anesthetic complications  Airway Mallampati: II  TM Distance: >3 FB Neck ROM: Full    Dental no notable dental hx. (+) Teeth Intact   Pulmonary neg pulmonary ROS, neg sleep apnea, neg COPD, Patient abstained from smoking.Not current smoker   Pulmonary exam normal breath sounds clear to auscultation       Cardiovascular Exercise Tolerance: Good METS(-) hypertension(-) CAD and (-) Past MI negative cardio ROS (-) dysrhythmias  Rhythm:Regular Rate:Normal - Systolic murmurs    Neuro/Psych negative neurological ROS  negative psych ROS   GI/Hepatic ,neg GERD  ,,(+)     (-) substance abuse    Endo/Other  neg diabetes    Renal/GU negative Renal ROS     Musculoskeletal   Abdominal   Peds  Hematology   Anesthesia Other Findings Past Medical History: No date: History of kidney stones No date: L4-L5 disc bulge     Comment:  Pt states he has rec'd 2 injections 6 months apart, last              one was 10/28/15 No date: MRSA infection     Comment:  infected hair follice in nose. now cleared  Reproductive/Obstetrics                              Anesthesia Physical Anesthesia Plan  ASA: 1  Anesthesia Plan: General   Post-op Pain Management: Tylenol  PO (pre-op)*, Celebrex PO (pre-op)* and Gabapentin PO (pre-op)*   Induction: Intravenous  PONV Risk Score and Plan: 2 and Ondansetron, Dexamethasone  and Midazolam  Airway Management Planned: Oral ETT  Additional Equipment: None  Intra-op Plan:   Post-operative Plan: Extubation in OR  Informed Consent: I have reviewed the patients History and Physical, chart, labs and discussed the procedure  including the risks, benefits and alternatives for the proposed anesthesia with the patient or authorized representative who has indicated his/her understanding and acceptance.     Dental advisory given  Plan Discussed with: CRNA and Surgeon  Anesthesia Plan Comments: (Discussed risks of anesthesia with patient, including PONV, sore throat, lip/dental/eye damage. Rare risks discussed as well, such as cardiorespiratory and neurological sequelae, and allergic reactions. Discussed the role of CRNA in patient's perioperative care. Patient understands.)        Anesthesia Quick Evaluation

## 2024-04-15 NOTE — Anesthesia Postprocedure Evaluation (Signed)
 Anesthesia Post Note  Patient: Ronald Tyler  Procedure(s) Performed: ABLATION, CONDYLOMA, USING LASER (Rectum)     Patient location during evaluation: PACU Anesthesia Type: General Level of consciousness: awake and alert Pain management: pain level controlled Vital Signs Assessment: post-procedure vital signs reviewed and stable Respiratory status: spontaneous breathing, nonlabored ventilation, respiratory function stable and patient connected to nasal cannula oxygen Cardiovascular status: blood pressure returned to baseline and stable Postop Assessment: no apparent nausea or vomiting Anesthetic complications: no   There were no known notable events for this encounter.  Last Vitals:  Vitals:   04/15/24 1130 04/15/24 1139  BP: (!) 130/94 (!) 131/95  Pulse: 81 71  Resp: 15 15  Temp:    SpO2: 99% 100%    Last Pain:  Vitals:   04/15/24 1139  TempSrc:   PainSc: 0-No pain                 Rome Ade

## 2024-04-15 NOTE — Transfer of Care (Signed)
 Immediate Anesthesia Transfer of Care Note  Patient: Ronald Tyler  Procedure(s) Performed: ABLATION, CONDYLOMA, USING LASER (Rectum)  Patient Location: PACU  Anesthesia Type:General  Level of Consciousness: awake, alert , oriented, and patient cooperative  Airway & Oxygen Therapy: Patient Spontanous Breathing and Patient connected to face mask oxygen  Post-op Assessment: Report given to RN and Post -op Vital signs reviewed and stable  Post vital signs: Reviewed and stable  Last Vitals:  Vitals Value Taken Time  BP 126/85 04/15/24 11:00  Temp 36.3 C 04/15/24 11:00  Pulse 94 04/15/24 11:03  Resp 17 04/15/24 11:03  SpO2 96 % 04/15/24 11:03  Vitals shown include unfiled device data.  Last Pain:  Vitals:   04/15/24 0858  TempSrc: Oral         Complications: There were no known notable events for this encounter.

## 2024-04-16 ENCOUNTER — Encounter (HOSPITAL_COMMUNITY): Payer: Self-pay | Admitting: General Surgery

## 2024-04-17 LAB — SURGICAL PATHOLOGY

## 2024-05-06 LAB — GENECONNECT MOLECULAR SCREEN: Genetic Analysis Overall Interpretation: NEGATIVE

## 2024-10-12 ENCOUNTER — Encounter: Admitting: Family Medicine
